# Patient Record
Sex: Male | Born: 1960 | Race: Black or African American | Hispanic: No | Marital: Married | Smoking: Never smoker
Health system: Southern US, Community
[De-identification: ages and names within clinical notes are randomized; demographics above are authoritative.]

## PROBLEM LIST (undated history)

## (undated) DIAGNOSIS — Z8719 Personal history of other diseases of the digestive system: Secondary | ICD-10-CM

## (undated) DIAGNOSIS — K859 Acute pancreatitis without necrosis or infection, unspecified: Secondary | ICD-10-CM

## (undated) HISTORY — PX: HERNIA REPAIR: SHX51

---

## 2013-03-15 ENCOUNTER — Emergency Department (HOSPITAL_COMMUNITY)
Admission: EM | Admit: 2013-03-15 | Discharge: 2013-03-16 | Disposition: A | Discharge status: Home / Self Care | Payer: BC Managed Care – PPO | Attending: Emergency Medicine | Admitting: Emergency Medicine

## 2013-03-15 ENCOUNTER — Encounter (HOSPITAL_COMMUNITY): Payer: Self-pay | Admitting: Emergency Medicine

## 2013-03-15 DIAGNOSIS — R21 Rash and other nonspecific skin eruption: Secondary | ICD-10-CM | POA: Insufficient documentation

## 2013-03-15 DIAGNOSIS — IMO0002 Reserved for concepts with insufficient information to code with codable children: Secondary | ICD-10-CM | POA: Insufficient documentation

## 2013-03-15 DIAGNOSIS — L408 Other psoriasis: Secondary | ICD-10-CM | POA: Insufficient documentation

## 2013-03-15 DIAGNOSIS — L02411 Cutaneous abscess of right axilla: Secondary | ICD-10-CM

## 2013-03-15 DIAGNOSIS — L409 Psoriasis, unspecified: Secondary | ICD-10-CM

## 2013-03-15 NOTE — ED Notes (Signed)
PT. REPORTS ABSCESS AT RIGHT AXILLA WITH DRAINAGE FOR SEVERAL DAYS .

## 2013-03-16 LAB — GLUCOSE, CAPILLARY: Glucose-Capillary: 94 mg/dL (ref 70–99)

## 2013-03-16 MED ORDER — SULFAMETHOXAZOLE-TRIMETHOPRIM 800-160 MG PO TABS: 1.0000 | ORAL_TABLET | Freq: Two times a day (BID) | ORAL | Status: DC

## 2013-03-16 MED ORDER — TRIAMCINOLONE ACETONIDE 0.1 % EX CREA: TOPICAL_CREAM | Freq: Two times a day (BID) | CUTANEOUS | Status: DC

## 2013-03-16 NOTE — ED Provider Notes (Signed)
Medical screening examination/treatment/procedure(s) were performed by non-physician practitioner and as supervising physician I was immediately available for consultation/collaboration.   Hanley Seamen, MD 03/16/13 [phone]

## 2013-03-16 NOTE — ED Provider Notes (Signed)
History     CSN: [id number]  Arrival date & time 03/15/13  2117   First MD Initiated Contact with Patient 03/16/13 0006      Chief Complaint  Patient presents with  . Abscess    (Consider location/radiation/quality/duration/timing/severity/associated sxs/prior treatment) Patient is a 52 y.o. male presenting with abscess. The history is provided by the patient and medical records. No language interpreter was used.  Abscess Location:  Shoulder/arm Shoulder/arm abscess location:  R axilla Abscess quality: draining, fluctuance, induration, painful, redness and warmth   Red streaking: no   Duration:  1 week Progression:  Worsening Pain details:    Quality:  Pressure and aching   Severity:  Mild   Timing:  Constant   Progression:  Unchanged Chronicity:  New Context: not diabetes, not immunosuppression, not injected drug use, not insect bite/sting and not skin injury   Relieved by:  Nothing Worsened by:  Nothing tried Ineffective treatments:  None tried Associated symptoms: no anorexia, no fatigue, no fever, no headaches, no nausea and no vomiting   Risk factors: prior abscess   Risk factors: no family hx of MRSA and no hx of MRSA     Adam Shaw is a 52 y.o. male  with no medical hx presents to the Emergency Department complaining of gradual, persistent, progressively worsening multiple abscesses onset 1 month ago.  pt states one on his hip and several on his arm that have healed without intervention.  He now has one in the right armpit that has been present for > 1 week and is not healing.  Pt states he has been using peroxide on the site after his shower each night.  The site began to drain several days ago. Associated symptoms include pain and drainage at the site.  Nothing makes it better and nothing makes it worse.  Pt denies fever, chills, headache, neck pain, chest pain, shortness of breath, abdominal pain, nausea, vomiting, diarrhea, weakness dizziness, syncope.    Pt also  c/o rash behind his L ear.  Described as itching and sore, appears intermittently and resolves spontaneously.  Pt states this episode has been present for 1 month.  Pt was given cream a long time ago for the rash, but does not remember what kind and does not have any more. There is no drainage from the site and no one else in the household has a rash.     History reviewed. No pertinent past medical history.  History reviewed. No pertinent past surgical history.  No family history on file.  History  Substance Use Topics  . Smoking status: Never Smoker   . Smokeless tobacco: Not on file  . Alcohol Use: No      Review of Systems  Constitutional: Negative for fever and fatigue.  HENT: Negative for neck pain and neck stiffness.   Respiratory: Negative for chest tightness and shortness of breath.   Cardiovascular: Negative for chest pain.  Gastrointestinal: Negative for nausea, vomiting and anorexia.  Musculoskeletal: Negative for joint swelling and arthralgias.  Skin: Positive for rash and wound.  Allergic/Immunologic: Negative for immunocompromised state.  Neurological: Negative for headaches.  Hematological: Does not bruise/bleed easily.  Psychiatric/Behavioral: The patient is not nervous/anxious.     Allergies  Review of patient's allergies indicates no known allergies.  Home Medications   Current Outpatient Rx  Name  Route  Sig  Dispense  Refill  . sulfamethoxazole-trimethoprim (SEPTRA DS) 800-160 MG per tablet   Oral   Take 1 tablet by mouth  every 12 (twelve) hours.   20 tablet   0   . triamcinolone cream (KENALOG) 0.1 %   Topical   Apply topically 2 (two) times daily.   30 g   1     BP 104/73  Pulse 58  Temp(Src) 98.1 F (36.7 C) (Oral)  Resp 14  SpO2 97%  Physical Exam  Nursing note and vitals reviewed. Constitutional: He is oriented to person, place, and time. He appears well-developed and well-nourished. No distress.  HENT:  Head: Normocephalic and  atraumatic.  Mouth/Throat: Oropharynx is clear and moist.  Eyes: Conjunctivae are normal. Pupils are equal, round, and reactive to light. No scleral icterus.  Neck: Normal range of motion.  Pt with 7 x7 cm inflammed, salmon colored patch with silver scales behind the L ear  Cardiovascular: Normal rate, regular rhythm, normal heart sounds and intact distal pulses.   Pulmonary/Chest: Effort normal and breath sounds normal. No respiratory distress. He has no wheezes. He has no rales.  Abdominal: Soft. He exhibits no distension. There is no tenderness.  Musculoskeletal: Normal range of motion.  Lymphadenopathy:    He has no cervical adenopathy.  Neurological: He is alert and oriented to person, place, and time. He exhibits normal muscle tone. Coordination normal.  Skin: Skin is warm and dry. He is not diaphoretic. There is erythema.  4cm x 2 cm abscess in the right axilla, erythema and induration with small amount of drainage, but pocket of fluctuance palpable, no streaking  No additional patches of rash noted to extensor surfaces of arms or legs  Psychiatric: He has a normal mood and affect.    ED Course  INCISION AND DRAINAGE Date/Time: 03/16/2013 1:44 AM Performed by: Dierdre Forth Authorized by: Dierdre Forth Consent: Verbal consent obtained. Risks and benefits: risks, benefits and alternatives were discussed Consent given by: patient Patient understanding: patient states understanding of the procedure being performed Patient consent: the patient's understanding of the procedure matches consent given Procedure consent: procedure consent matches procedure scheduled Relevant documents: relevant documents present and verified Site marked: the operative site was marked Required items: required blood products, implants, devices, and special equipment available Patient identity confirmed: verbally with patient and arm band Time out: Immediately prior to procedure a "time out"  was called to verify the correct patient, procedure, equipment, support staff and site/side marked as required. Type: abscess Body area: upper extremity (right axilla) Anesthesia: local infiltration Local anesthetic: lidocaine 2% without epinephrine Anesthetic total: 5 ml Patient sedated: no Scalpel size: 11 Incision type: elliptical Complexity: complex Drainage: purulent Drainage amount: moderate Wound treatment: wound left open Patient tolerance: Patient tolerated the procedure well with no immediate complications.   (including critical care time)  Labs Reviewed  GLUCOSE, CAPILLARY   No results found.   1. Abscess of axilla, right   2. Psoriasis       MDM  Adam Shaw presents with abscess.  Pt with reports of recurrent abscess, but the is the first time he has been evaluated.  Patient with skin abscess amenable to incision and drainage.  Abscess was not large enough to warrant packing or drain,  wound recheck in 2 days. Encouraged home warm soaks and flushing.  Mild signs of cellulitis is surrounding skin.  Will d/c to home with bactrim due to concerns for MRSA with recurrent abscess.  Pt without Hx of diabetes and CBG 94 in the department.  No hx of immunosuppression, no risk for HIV, no recent use of steroids.  Encouraged  pt to find PCP or return to urgent care for wound check.  He said also with rash consistent with psoriasis. Will send home with triamcinolone cream twice a day. I have also discussed reasons to return immediately to the ER.  Patient expresses understanding and agrees with plan.          Adam Client Isreal Moline, PA-C 03/16/13 [phone]

## 2013-12-31 ENCOUNTER — Encounter (HOSPITAL_COMMUNITY): Payer: Self-pay | Admitting: Emergency Medicine

## 2013-12-31 ENCOUNTER — Inpatient Hospital Stay (HOSPITAL_COMMUNITY)
Admission: EM | Admit: 2013-12-31 | Discharge: 2014-01-01 | DRG: 440 | Disposition: A | Discharge status: Home / Self Care | Payer: BC Managed Care – PPO | Attending: Internal Medicine | Admitting: Internal Medicine

## 2013-12-31 ENCOUNTER — Inpatient Hospital Stay (HOSPITAL_COMMUNITY): Payer: BC Managed Care – PPO

## 2013-12-31 ENCOUNTER — Emergency Department (HOSPITAL_COMMUNITY): Payer: BC Managed Care – PPO

## 2013-12-31 DIAGNOSIS — K859 Acute pancreatitis without necrosis or infection, unspecified: Principal | ICD-10-CM | POA: Insufficient documentation

## 2013-12-31 DIAGNOSIS — K219 Gastro-esophageal reflux disease without esophagitis: Secondary | ICD-10-CM | POA: Diagnosis present

## 2013-12-31 LAB — COMPREHENSIVE METABOLIC PANEL
ALBUMIN: 3.3 g/dL — AB (ref 3.5–5.2)
ALK PHOS: 99 U/L (ref 39–117)
ALT: 29 U/L (ref 0–53)
AST: 49 U/L — ABNORMAL HIGH (ref 0–37)
BUN: 19 mg/dL (ref 6–23)
CALCIUM: 8.8 mg/dL (ref 8.4–10.5)
CO2: 26 mEq/L (ref 19–32)
Chloride: 102 mEq/L (ref 96–112)
Creatinine, Ser: 0.95 mg/dL (ref 0.50–1.35)
GFR calc non Af Amer: >90 mL/min (ref >90)
GLUCOSE: 177 mg/dL — AB (ref 70–99)
POTASSIUM: 3.9 meq/L (ref 3.7–5.3)
SODIUM: 140 meq/L (ref 137–147)
TOTAL PROTEIN: 7.1 g/dL (ref 6.0–8.3)
Total Bilirubin: 0.3 mg/dL (ref 0.3–1.2)

## 2013-12-31 LAB — CBC WITH DIFFERENTIAL/PLATELET
BASOS PCT: 0 % (ref 0–1)
Basophils Absolute: 0 10*3/uL (ref 0.0–0.1)
EOS ABS: 0.1 10*3/uL (ref 0.0–0.7)
EOS PCT: 1 % (ref 0–5)
HCT: 41.1 % (ref 39.0–52.0)
Hemoglobin: 13.6 g/dL (ref 13.0–17.0)
LYMPHS ABS: 2.5 10*3/uL (ref 0.7–4.0)
Lymphocytes Relative: 29 % (ref 12–46)
MCH: 27.2 pg (ref 26.0–34.0)
MCHC: 33.1 g/dL (ref 30.0–36.0)
MCV: 82.2 fL (ref 78.0–100.0)
Monocytes Absolute: 0.5 10*3/uL (ref 0.1–1.0)
Monocytes Relative: 6 % (ref 3–12)
NEUTROS PCT: 64 % (ref 43–77)
Neutro Abs: 5.4 10*3/uL (ref 1.7–7.7)
PLATELETS: 167 10*3/uL (ref 150–400)
RBC: 5 MIL/uL (ref 4.22–5.81)
RDW: 13.6 % (ref 11.5–15.5)
WBC: 8.5 10*3/uL (ref 4.0–10.5)

## 2013-12-31 LAB — POCT I-STAT TROPONIN I: TROPONIN I, POC: 0 ng/mL (ref 0.00–0.08)

## 2013-12-31 LAB — URINALYSIS, ROUTINE W REFLEX MICROSCOPIC
BILIRUBIN URINE: NEGATIVE
Glucose, UA: NEGATIVE mg/dL
HGB URINE DIPSTICK: NEGATIVE
Ketones, ur: NEGATIVE mg/dL
Leukocytes, UA: NEGATIVE
NITRITE: NEGATIVE
PH: 6.5 (ref 5.0–8.0)
Protein, ur: NEGATIVE mg/dL
SPECIFIC GRAVITY, URINE: 1.028 (ref 1.005–1.030)
UROBILINOGEN UA: 1 mg/dL (ref 0.0–1.0)

## 2013-12-31 LAB — TRIGLYCERIDES: TRIGLYCERIDES: 40 mg/dL (ref <150)

## 2013-12-31 LAB — LIPASE, BLOOD: LIPASE: 718 U/L — AB (ref 11–59)

## 2013-12-31 MED ORDER — SODIUM CHLORIDE 0.9 % IV SOLN
INTRAVENOUS | Status: DC
  Administered 2013-12-31 – 2014-01-01 (×2): via INTRAVENOUS
  Administered 2014-01-01 (×2): 1000 mL via INTRAVENOUS

## 2013-12-31 MED ORDER — SODIUM CHLORIDE 0.9 % IV SOLN
Freq: Once | INTRAVENOUS | Status: AC
  Administered 2013-12-31: 04:00:00 via INTRAVENOUS

## 2013-12-31 MED ORDER — PANTOPRAZOLE SODIUM 40 MG IV SOLR
40.0000 mg | INTRAVENOUS | Status: DC
  Administered 2013-12-31 – 2014-01-01 (×2): 40 mg via INTRAVENOUS
  Filled 2013-12-31 (×3): qty 40

## 2013-12-31 MED ORDER — ONDANSETRON HCL 4 MG PO TABS: 4.0000 mg | ORAL_TABLET | Freq: Four times a day (QID) | ORAL | Status: DC | PRN

## 2013-12-31 MED ORDER — MORPHINE SULFATE 2 MG/ML IJ SOLN: 2.0000 mg | INTRAMUSCULAR | Status: DC | PRN

## 2013-12-31 MED ORDER — MORPHINE SULFATE 4 MG/ML IJ SOLN
4.0000 mg | Freq: Once | INTRAMUSCULAR | Status: AC
  Administered 2013-12-31: 4 mg via INTRAVENOUS
  Filled 2013-12-31: qty 1

## 2013-12-31 MED ORDER — ONDANSETRON HCL 4 MG/2ML IJ SOLN: 4.0000 mg | Freq: Four times a day (QID) | INTRAMUSCULAR | Status: DC | PRN

## 2013-12-31 MED ORDER — HEPARIN SODIUM (PORCINE) 5000 UNIT/ML IJ SOLN
5000.0000 [IU] | Freq: Three times a day (TID) | INTRAMUSCULAR | Status: DC
  Administered 2013-12-31 – 2014-01-01 (×4): 5000 [IU] via SUBCUTANEOUS
  Filled 2013-12-31 (×6): qty 1

## 2013-12-31 MED ORDER — GADOBENATE DIMEGLUMINE 529 MG/ML IV SOLN
15.0000 mL | Freq: Once | INTRAVENOUS | Status: AC | PRN
  Administered 2013-12-31: 11 mL via INTRAVENOUS

## 2013-12-31 NOTE — ED Notes (Signed)
Dr. Horton at bedside. 

## 2013-12-31 NOTE — ED Provider Notes (Signed)
CSN: [id number]     Arrival date & time 12/31/13  0035 History   First MD Initiated Contact with Patient 12/31/13 0111     Chief Complaint  Patient presents with  . Abdominal Pain   (Consider location/radiation/quality/duration/timing/severity/associated sxs/prior Treatment) HPI  This a 53 year old male with no significant past medical history who presents with epigastric abdominal pain. Onset of symptoms was approximately one hour prior to arrival. Patient reports "fullness in her stomach." He states that it radiated upwards into his chest. He had just eating. Patient denies any chest pain or shortness of breath. He does state that the pain made him feel like he could not catch his breath. Currently his pain is 4/10. He's never had anything like this in the past. He denies any nausea vomiting or diarrhea. He denies any fevers.  History reviewed. No pertinent past medical history. Past Surgical History  Procedure Laterality Date  . Hernia repair     No family history on file. History  Substance Use Topics  . Smoking status: Never Smoker   . Smokeless tobacco: Not on file  . Alcohol Use: No    Review of Systems  Constitutional: Negative.  Negative for fever.  Respiratory: Negative.  Negative for chest tightness and shortness of breath.   Cardiovascular: Negative.  Negative for chest pain.  Gastrointestinal: Positive for abdominal pain. Negative for nausea, vomiting and diarrhea.  Genitourinary: Negative.  Negative for dysuria.  Musculoskeletal: Negative for back pain.  Neurological: Negative for headaches.  All other systems reviewed and are negative.    Allergies  Review of patient's allergies indicates no known allergies.  Home Medications   Current Outpatient Rx  Name  Route  Sig  Dispense  Refill  . Multiple Vitamin (MULTIVITAMIN WITH MINERALS) TABS tablet   Oral   Take 1 tablet by mouth daily.          BP 100/67  Pulse 65  Temp(Src) 98.7 F (37.1 C) (Oral)   Resp 20  Ht 5\' 7"  (1.702 m)  Wt 125 lb (56.7 kg)  BMI 19.57 kg/m2  SpO2 99% Physical Exam  Nursing note and vitals reviewed. Constitutional: He is oriented to person, place, and time. He appears well-developed and well-nourished. No distress.  HENT:  Head: Normocephalic and atraumatic.  Eyes: Pupils are equal, round, and reactive to light.  Neck: Neck supple.  Cardiovascular: Normal rate, regular rhythm and normal heart sounds.   No murmur heard. Pulmonary/Chest: Effort normal and breath sounds normal. No respiratory distress. He has no wheezes.  Abdominal: Soft. Bowel sounds are normal. There is tenderness. There is no rebound.  Tenderness palpation of the epigastrium without rebound or guarding  Musculoskeletal: He exhibits no edema.  Lymphadenopathy:    He has no cervical adenopathy.  Neurological: He is alert and oriented to person, place, and time.  Skin: Skin is warm and dry.  Psychiatric: He has a normal mood and affect.    ED Course  Procedures (including critical care time) Labs Review Labs Reviewed  COMPREHENSIVE METABOLIC PANEL - Abnormal; Notable for the following:    Glucose, Bld 177 (*)    Albumin 3.3 (*)    AST 49 (*)    All other components within normal limits  LIPASE, BLOOD - Abnormal; Notable for the following:    Lipase 718 (*)    All other components within normal limits  CBC WITH DIFFERENTIAL  URINALYSIS, ROUTINE W REFLEX MICROSCOPIC  POCT I-STAT TROPONIN I   Imaging Review Dg Chest  2 View  12/31/2013   CLINICAL DATA:  Epigastric pain and bloating.  EXAM: CHEST  2 VIEW  COMPARISON:  None available for comparison at time of study interpretation.  FINDINGS: Cardiomediastinal silhouette is unremarkable. The lungs are clear without pleural effusions or focal consolidations. Trace biapical pleural thickening. Trachea projects midline and there is no pneumothorax. Soft tissue planes and included osseous structures are non-suspicious. Multiple EKG lines  overlie the patient and may obscure subtle underlying pathology.  IMPRESSION: No active cardiopulmonary disease.   Electronically Signed   By: Elon Alas   On: 12/31/2013 03:11   US Abdomen Complete  12/31/2013   CLINICAL DATA:  Acute pancreatitis  EXAM: ULTRASOUND ABDOMEN COMPLETE  COMPARISON:  None.  FINDINGS: Gallbladder:  There is thickening of the gallbladder wall to 4 mm, with echogenic intramural foci with ring down artifact. No stone is seen. No focal tenderness.  Common bile duct:  Diameter: Measures up to 9 mm. No visible filling defect. The it is noted that there is no cholestasis on contemporaneously labs.  Liver:  No focal lesion identified. Within normal limits in parenchymal echogenicity.  IVC:  No abnormality visualized.  Pancreas:  Visualized portion unremarkable.  Spleen:  Size and appearance within normal limits.  Right Kidney:  Length: 9 cm. 2 cm peripelvic, simple appearing cyst. No hydronephrosis.  Left Kidney:  Length: 9 cm. Echogenicity within normal limits. No mass or hydronephrosis visualized.  Abdominal aorta:  No aneurysm visualized.  Other findings:  None.  IMPRESSION: 1. No cholelithiasis. 2. Enlargement of the common bile duct to 9 mm. No visible choledocholithiasis. 3. Adenomyomatosis. 4. 2 cm right renal cyst.   Electronically Signed   By: Jorje Guild M.D.   On: 12/31/2013 05:41    EKG Interpretation    Date/Time:  Monday December 31 2013 01:05:15 EST Ventricular Rate:  62 PR Interval:  150 QRS Duration: 84 QT Interval:  384 QTC Calculation: 389 R Axis:   -15 Text Interpretation:  Normal sinus rhythm Nonspecific T wave abnormality No prior for comparison   Confirmed by HORTON  MD, COURTNEY ([id number]) on 12/31/2013 2:03:55 AM            MDM   1. Acute pancreatitis     Patient presents with epigastric pain. He is nontoxic-appearing on exam. No evidence of peritonitis. Initial vital signs notable for blood pressure of 98/63. He is generally comfortable  appearing. Patient initially declined pain medication. Lab work is notable for a lipase of >700.  I reexamined the patient and he continues to not have any signs of peritonitis. I will give him 4 mg of morphine and he was started on maintenance fluids. Patient denies any history of gallstones, alcohol abuse, or high cholesterol. Will admit the patient for further management.    Merryl Hacker, MD 12/31/13 [phone]

## 2013-12-31 NOTE — ED Notes (Signed)
Admitting physician, Dr. Posey Pronto, at bedside.

## 2013-12-31 NOTE — Progress Notes (Signed)
Received report from ED.  

## 2013-12-31 NOTE — Consult Note (Signed)
[facility] Gastroenterology Consultation Note  Referring Provider: Dr. Marcheta Grammes [facility]) Primary Care Physician:  No PCP Per Patient  Reason for Consultation:  Acute pancreatitis  HPI: Adam Shaw is a 53 y.o. male whom we've been asked to see for acute pancreatitis.  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  Yesterday, patient had rather acute onset of epigastric pain with radiation to his back. Some nausea and vomiting.  Went to ED, was found to have pancreatitis based on labs (lipase > 700).  Ultrasound showed no gallstones/sludge, but this study and his MRCP showed CBD 16mm.  Liver tests essentially normal.  No prior personal history of pancreatitis.  No family history of pancreatitis.  Takes multivitamin, but no other chronic medications.  Pain today is much improved compared with yesterday.   History reviewed. No pertinent past medical history.  Past Surgical History  Procedure Laterality Date  . Hernia repair      Prior to Admission medications   Medication Sig Start Date End Date Taking? Authorizing Provider  Multiple Vitamin (MULTIVITAMIN WITH MINERALS) TABS tablet Take 1 tablet by mouth daily.   Yes Historical Provider, MD    Current Facility-Administered Medications  Medication Dose Route Frequency Provider Last Rate Last Dose  . 0.9 %  sodium chloride infusion   Intravenous Continuous Berle Mull, MD 125 mL/hr at 12/31/13 0845    . heparin injection 5,000 Units  5,000 Units Subcutaneous Q8H Berle Mull, MD   5,000 Units at 12/31/13 1200  . morphine 2 MG/ML injection 2 mg  2 mg Intravenous Q3H PRN Berle Mull, MD      . ondansetron (ZOFRAN) tablet 4 mg  4 mg Oral Q6H PRN Berle Mull, MD       Or  . ondansetron (ZOFRAN) injection 4 mg  4 mg Intravenous Q6H PRN Berle Mull, MD      . pantoprazole (PROTONIX) injection 40 mg  40 mg Intravenous Q24H Berle Mull, MD   40 mg at 12/31/13 0804    Allergies as of 12/31/2013  . (No Known Allergies)    History  reviewed. No pertinent family history.  History   Social History  . Marital Status: Married    Spouse Name: N/A    Number of Children: N/A  . Years of Education: N/A   Occupational History  . Not on file.   Social History Main Topics  . Smoking status: Never Smoker   . Smokeless tobacco: Not on file  . Alcohol Use: No  . Drug Use: No  . Sexual Activity: Yes   Other Topics Concern  . Not on file   Social History Narrative  . No narrative on file    Review of Systems: As per HPI, all others negative.  Physical Exam: Vital signs in last 24 hours: Temp:  [97.7 F (36.5 C)-98.7 F (37.1 C)] 97.7 F (36.5 C) (02/02 0833) Pulse Rate:  [56-74] 62 (02/02 0833) Resp:  [16-20] 20 (02/02 0215) BP: (98-109)/(60-82) 102/65 mmHg (02/02 0833) SpO2:  [97 %-99 %] 98 % (02/02 0833) Weight:  [56.7 kg (125 lb)] 56.7 kg (125 lb) (02/02 [id number])   General:   Alert,  Somnolent but arousable, Well-developed, well-nourished, pleasant and cooperative in NAD Head:  Normocephalic and atraumatic. Eyes:  Sclera clear, no icterus.   Conjunctiva pink. Ears:  Normal auditory acuity. Nose:  No deformity, discharge,  or lesions. Mouth:  No deformity or lesions.  Oropharynx pink but somewhat dry. Neck:  Supple; no masses or  thyromegaly. Lungs:  Clear throughout to auscultation.   No wheezes, crackles, or rhonchi. No acute distress. Heart:  Regular rate and rhythm; no murmurs, clicks, rubs,  or gallops. Abdomen:  Soft, nondistended, mild epigastric tenderness to deep palpation. No masses, hepatosplenomegaly or hernias noted. Normal bowel sounds, without guarding, and without rebound.     Msk:  Symmetrical without gross deformities. Normal posture. Pulses:  Normal pulses noted. Extremities:  Without clubbing or edema. Neurologic:  Alert and  oriented x4;  Diffusely weak, otherwise grossly normal neurologically. Skin:  Intact without significant lesions or rashes. Psych:  Alert and cooperative. Normal  mood and affect.   Lab Results:  Recent Labs  12/31/13 0103  WBC 8.5  HGB 13.6  HCT 41.1  PLT 167   BMET  Recent Labs  12/31/13 0103  NA 140  K 3.9  CL 102  CO2 26  GLUCOSE 177*  BUN 19  CREATININE 0.95  CALCIUM 8.8   LFT  Recent Labs  12/31/13 0103  PROT 7.1  ALBUMIN 3.3*  AST 49*  ALT 29  ALKPHOS 99  BILITOT 0.3   PT/INR No results found for this basename: LABPROT, INR,  in the last 72 hours  Studies/Results: Dg Chest 2 View  12/31/2013   CLINICAL DATA:  Epigastric pain and bloating.  EXAM: CHEST  2 VIEW  COMPARISON:  None available for comparison at time of study interpretation.  FINDINGS: Cardiomediastinal silhouette is unremarkable. The lungs are clear without pleural effusions or focal consolidations. Trace biapical pleural thickening. Trachea projects midline and there is no pneumothorax. Soft tissue planes and included osseous structures are non-suspicious. Multiple EKG lines overlie the patient and may obscure subtle underlying pathology.  IMPRESSION: No active cardiopulmonary disease.   Electronically Signed   By: Elon Alas   On: 12/31/2013 03:11   US Abdomen Complete  12/31/2013   CLINICAL DATA:  Acute pancreatitis  EXAM: ULTRASOUND ABDOMEN COMPLETE  COMPARISON:  None.  FINDINGS: Gallbladder:  There is thickening of the gallbladder wall to 4 mm, with echogenic intramural foci with ring down artifact. No stone is seen. No focal tenderness.  Common bile duct:  Diameter: Measures up to 9 mm. No visible filling defect. The it is noted that there is no cholestasis on contemporaneously labs.  Liver:  No focal lesion identified. Within normal limits in parenchymal echogenicity.  IVC:  No abnormality visualized.  Pancreas:  Visualized portion unremarkable.  Spleen:  Size and appearance within normal limits.  Right Kidney:  Length: 9 cm. 2 cm peripelvic, simple appearing cyst. No hydronephrosis.  Left Kidney:  Length: 9 cm. Echogenicity within normal limits. No  mass or hydronephrosis visualized.  Abdominal aorta:  No aneurysm visualized.  Other findings:  None.  IMPRESSION: 1. No cholelithiasis. 2. Enlargement of the common bile duct to 9 mm. No visible choledocholithiasis. 3. Adenomyomatosis. 4. 2 cm right renal cyst.   Electronically Signed   By: Jorje Guild M.D.   On: 12/31/2013 05:41   Mr 3d Recon At Scanner  12/31/2013   CLINICAL DATA:  Epigastric abdominal pain. Dilated common bile duct on ultrasound.  EXAM: MRI ABDOMEN WITHOUT AND WITH CONTRAST (INCLUDING MRCP)  TECHNIQUE: Multiplanar multisequence MR imaging of the abdomen was performed both before and after the administration of intravenous contrast. Heavily T2-weighted images of the biliary and pancreatic ducts were obtained, and three-dimensional MRCP images were rendered by post processing.  CONTRAST:  80mL MULTIHANCE GADOBENATE DIMEGLUMINE 529 MG/ML IV SOLN  COMPARISON:  MR  MRCP WO/W CM dated 12/31/2013; US ABDOMEN COMPLETE dated 12/31/2013  FINDINGS: Probable volume loss and atelectasis the anterior right lung base. Normal liver, spleen. Underdistended proximal stomach.  Normal gallbladder. Normal pancreas, without pancreatic ductal dilatation.  The intrahepatic ducts are minimally dilated, including a 4 mm left hepatic duct on image 75/series 11. The common duct measures 7 mm maximally in the porta hepatis on image 67/series 11. Upper normal 6 mm in this age group. Tapers to 5 mm in the region of the pancreatic head. No evidence of obstructive stone or mass.  Normal adrenal glands. Tiny bilateral renal cysts with a cyst or minimally complex cyst in the interpolar right kidney measuring 1.6 cm.  No abdominal adenopathy or ascites.  IMPRESSION: 1. Minimal intra and extrahepatic biliary ductal dilatation, without evidence of obstructive stone or mass. This could be within normal variation for this patient. If there is a high clinical concern of otherwise occult ampullary stenosis or ampullary lesion, ERCP  should be considered. 2. No other explanation for epigastric abdominal pain.   Electronically Signed   By: Abigail Miyamoto M.D.   On: 12/31/2013 13:33   Mr Jeananne Rama W/wo Cm/mrcp  12/31/2013   CLINICAL DATA:  Epigastric abdominal pain. Dilated common bile duct on ultrasound.  EXAM: MRI ABDOMEN WITHOUT AND WITH CONTRAST (INCLUDING MRCP)  TECHNIQUE: Multiplanar multisequence MR imaging of the abdomen was performed both before and after the administration of intravenous contrast. Heavily T2-weighted images of the biliary and pancreatic ducts were obtained, and three-dimensional MRCP images were rendered by post processing.  CONTRAST:  64mL MULTIHANCE GADOBENATE DIMEGLUMINE 529 MG/ML IV SOLN  COMPARISON:  MR MRCP WO/W CM dated 12/31/2013; US ABDOMEN COMPLETE dated 12/31/2013  FINDINGS: Probable volume loss and atelectasis the anterior right lung base. Normal liver, spleen. Underdistended proximal stomach.  Normal gallbladder. Normal pancreas, without pancreatic ductal dilatation.  The intrahepatic ducts are minimally dilated, including a 4 mm left hepatic duct on image 75/series 11. The common duct measures 7 mm maximally in the porta hepatis on image 67/series 11. Upper normal 6 mm in this age group. Tapers to 5 mm in the region of the pancreatic head. No evidence of obstructive stone or mass.  Normal adrenal glands. Tiny bilateral renal cysts with a cyst or minimally complex cyst in the interpolar right kidney measuring 1.6 cm.  No abdominal adenopathy or ascites.  IMPRESSION: 1. Minimal intra and extrahepatic biliary ductal dilatation, without evidence of obstructive stone or mass. This could be within normal variation for this patient. If there is a high clinical concern of otherwise occult ampullary stenosis or ampullary lesion, ERCP should be considered. 2. No other explanation for epigastric abdominal pain.   Electronically Signed   By: Abigail Miyamoto M.D.   On: 12/31/2013 13:33   Impression:  1.  Acute idiopathic  pancreatitis.  Patient is not clinically toxic at this time, has no features of severe pancreatitis.  Liver tests essentially normal, except mild elevation of AST.  No alcohol and no obvious gallstones or CBD stones on Ultrasound and MRCP.  Plan:  1.  Fasting triglycerides.  ANA, IgG-4 levels. 2.  After labs, start sips clear liquids. 3.  Continued volume repletion with IV fluids and judicious analgesics. 4.  Pending above labs and work-up, if etiology of pancreatitis remains unclear, could consider outpatient endoscopic ultrasound. 5.  Will follow; thank you for the consult.   LOS: 0 days   Amaree Leeper M  12/31/2013, 4:03 PM

## 2013-12-31 NOTE — Progress Notes (Signed)
UR completed. Iylah Dworkin RN CCM Case Mgmt 

## 2013-12-31 NOTE — ED Notes (Signed)
Pt. reports mid/upper abdominal pain onset this evening , denies nausea /vomitting or diarrhea . No fever or chills.

## 2013-12-31 NOTE — Progress Notes (Signed)
Patient seen and examined. Admitted after midnight secondary to abd pain, nausea, vomiting. Elevated lipase and also dilated CBD. No gallstones seen on abd [place]. Please referred to H&P by Dr. Posey Pronto for further info/details.  Plan: -will consult surgery and GI -check MRCP -continue NPO status -continue IV fluids, PRN antiemetics and Pain meds. -will check lipid panel  Daine Gunther [phone]

## 2013-12-31 NOTE — H&P (Signed)
[facility] History and Physical  Patient: Adam Shaw  QQP:[id number]  DOB: March 12, 1961  DOS: the patient was seen and examined on 12/31/2013 PCP: No PCP Per Patient  Chief Complaint: Abdominal pain  HPI: Adam Shaw is a 53 y.o. male with no significant Past medical history . The patient is coming from home. The patient presented with complaints of epigastric abdominal pain that started today. He mentions that he felt that his stomach was full and bloated. Along with that he had some burning pain. He did not have any pain in his chest no shortness of breath no fever no chills. He mentions that he never had any similar pain in the right upper quadrant pain with fatty meals in the past no history of prior surgery it the nausea no vomiting no diarrhea no constipation no burning urination no rash anywhere. No weight loss.  Review of Systems: as mentioned in the history of present illness.  A Comprehensive review of the other systems is negative.  History reviewed. No pertinent past medical history. Past Surgical History  Procedure Laterality Date  . Hernia repair     Social History:  reports that he has never smoked. He does not have any smokeless tobacco history on file. He reports that he does not drink alcohol or use illicit drugs. Independent for most of his  ADL.  No Known Allergies  No family history on file.  Prior to Admission medications   Medication Sig Start Date End Date Taking? Authorizing Provider  Multiple Vitamin (MULTIVITAMIN WITH MINERALS) TABS tablet Take 1 tablet by mouth daily.   Yes Historical Provider, MD    Physical Exam: Filed Vitals:   12/31/13 0053 12/31/13 0130 12/31/13 0215  BP: 98/63 104/65 100/67  Pulse: 67 73 65  Temp: 98.7 F (37.1 C)    TempSrc: Oral    Resp: 16 19 20   Height: 5\' 7"  (1.702 m)    Weight: 56.7 kg (125 lb)    SpO2: 98% 98% 99%    General: Alert, Awake and Oriented to Time, Place and Person. Appear in mild  distress Eyes: PERRL ENT: Oral Mucosa clear dry. Neck: No JVD Cardiovascular: S1 and S2 Present, no Murmur, Peripheral Pulses Present Respiratory: Bilateral Air entry equal and Decreased, Clear to Auscultation,  No Crackles, no wheezes Abdomen: Bowel Sound Present, Soft and minimal tender epigastric, Murphy's negative Skin: No Rash Extremities: No Pedal edema, no calf tenderness Neurologic: Grossly Unremarkable.  Labs on Admission:  CBC:  Recent Labs Lab 12/31/13 0103  WBC 8.5  NEUTROABS 5.4  HGB 13.6  HCT 41.1  MCV 82.2  PLT 167    CMP     Component Value Date/Time   NA 140 12/31/2013 0103   K 3.9 12/31/2013 0103   CL 102 12/31/2013 0103   CO2 26 12/31/2013 0103   GLUCOSE 177* 12/31/2013 0103   BUN 19 12/31/2013 0103   CREATININE 0.95 12/31/2013 0103   CALCIUM 8.8 12/31/2013 0103   PROT 7.1 12/31/2013 0103   ALBUMIN 3.3* 12/31/2013 0103   AST 49* 12/31/2013 0103   ALT 29 12/31/2013 0103   ALKPHOS 99 12/31/2013 0103   BILITOT 0.3 12/31/2013 0103   GFRNONAA >90 12/31/2013 0103   GFRAA >90 12/31/2013 0103     Recent Labs Lab 12/31/13 0103  LIPASE 718*   No results found for this basename: AMMONIA,  in the last 168 hours  No results found for this basename: CKTOTAL, CKMB, CKMBINDEX, TROPONINI,  in the last 168 hours  BNP (last 3 results) No results found for this basename: PROBNP,  in the last 8760 hours  Radiological Exams on Admission: Dg Chest 2 View  12/31/2013   CLINICAL DATA:  Epigastric pain and bloating.  EXAM: CHEST  2 VIEW  COMPARISON:  None available for comparison at time of study interpretation.  FINDINGS: Cardiomediastinal silhouette is unremarkable. The lungs are clear without pleural effusions or focal consolidations. Trace biapical pleural thickening. Trachea projects midline and there is no pneumothorax. Soft tissue planes and included osseous structures are non-suspicious. Multiple EKG lines overlie the patient and may obscure subtle underlying pathology.  IMPRESSION: No  active cardiopulmonary disease.   Electronically Signed   By: Elon Alas   On: 12/31/2013 03:11     Assessment/Plan Principal Problem:   Acute pancreatitis   1. Acute pancreatitis The patient is presenting with complaints of abdominal pain which is epigastric in location. He is hemodynamically stable. He has elevated lipase. He does not have any other a lab abnormality. With this he was admitted to the hospital we will get an ultrasound of his abdomen to rule out any gallstones. He denies any history of alcohol or recent viral infection. He denies any similar episode in the past. IV fluids, IV Zofran as needed, IV Protonix.  DVT Prophylaxis: subcutaneous Heparin Nutrition: N.p.o.  Code Status: Full  Family Communication: Family was present at bedside, opportunity was given to ask question and all questions were answered satisfactorily at the time of interview. Disposition: Admitted to inpatient in med-surge unit.  Author: Berle Mull, MD [facility] Pager: [phone] 12/31/2013, 5:34 AM    If 7PM-7AM, please contact night-coverage [email or web address] Password [email or web address]

## 2014-01-01 LAB — CBC
HCT: 39.5 % (ref 39.0–52.0)
Hemoglobin: 13.2 g/dL (ref 13.0–17.0)
MCH: 27.6 pg (ref 26.0–34.0)
MCHC: 33.4 g/dL (ref 30.0–36.0)
MCV: 82.6 fL (ref 78.0–100.0)
PLATELETS: 174 10*3/uL (ref 150–400)
RBC: 4.78 MIL/uL (ref 4.22–5.81)
RDW: 13.5 % (ref 11.5–15.5)
WBC: 7.1 10*3/uL (ref 4.0–10.5)

## 2014-01-01 LAB — COMPREHENSIVE METABOLIC PANEL
ALT: 20 U/L (ref 0–53)
AST: 18 U/L (ref 0–37)
Albumin: 2.8 g/dL — ABNORMAL LOW (ref 3.5–5.2)
Alkaline Phosphatase: 92 U/L (ref 39–117)
BILIRUBIN TOTAL: 0.5 mg/dL (ref 0.3–1.2)
BUN: 8 mg/dL (ref 6–23)
CHLORIDE: 108 meq/L (ref 96–112)
CO2: 24 mEq/L (ref 19–32)
CREATININE: 0.95 mg/dL (ref 0.50–1.35)
Calcium: 8.3 mg/dL — ABNORMAL LOW (ref 8.4–10.5)
GFR calc Af Amer: >90 mL/min (ref >90)
GFR calc non Af Amer: >90 mL/min (ref >90)
Glucose, Bld: 91 mg/dL (ref 70–99)
Potassium: 4.2 mEq/L (ref 3.7–5.3)
Sodium: 142 mEq/L (ref 137–147)
TOTAL PROTEIN: 6.1 g/dL (ref 6.0–8.3)

## 2014-01-01 LAB — LIPID PANEL
CHOL/HDL RATIO: 3.5 ratio
Cholesterol: 151 mg/dL (ref 0–200)
HDL: 43 mg/dL (ref >39)
LDL Cholesterol: 97 mg/dL (ref 0–99)
Triglycerides: 54 mg/dL (ref <150)
VLDL: 11 mg/dL (ref 0–40)

## 2014-01-01 LAB — LIPASE, BLOOD: LIPASE: 24 U/L (ref 11–59)

## 2014-01-01 LAB — IGG, IGA, IGM
IgA: 229 mg/dL (ref 68–379)
IgG (Immunoglobin G), Serum: 1450 mg/dL (ref 650–1600)
IgM, Serum: 62 mg/dL (ref 41–251)

## 2014-01-01 LAB — ANA: ANA: NEGATIVE

## 2014-01-01 MED ORDER — OXYCODONE HCL 5 MG PO TABS: 5.0000 mg | ORAL_TABLET | ORAL | Status: DC | PRN

## 2014-01-01 MED ORDER — OMEPRAZOLE 40 MG PO CPDR: 40.0000 mg | DELAYED_RELEASE_CAPSULE | Freq: Every day | ORAL | Status: DC

## 2014-01-01 NOTE — Discharge Summary (Signed)
Physician Discharge Summary  Adam Shaw [id number] DOB: 16-Mar-1961 DOA: 12/31/2013  PCP: No PCP Per Patient  Admit date: 12/31/2013 Discharge date: 01/01/2014  Time spent: >30 minutes  Recommendations for Outpatient Follow-up:  Follow a low fat diet and advance food quantity slowly Keep yourself well hydrated Tylenol 500mg  every 6 hours as needed for pain; if not relieved then use 1 tablet of oxycodone as prescribed. Arrange visit with a primary care physician to establish care Arrange visit with Dr. Paulita Fujita (Gastroenterologist) in 3-4 weeks foe outpatient endoscopic [place] and colonoscopy.  Discharge Diagnoses:  Principal Problem:   Acute pancreatitis GERD  Discharge Condition: stable and improved. Will discharge home. Outpatient follow up with [facility] for endoscopic [place] and colonoscopy screening   Diet recommendation: low fat diet  Filed Weights   12/31/13 0053 12/31/13 0833  Weight: 56.7 kg (125 lb) 56.7 kg (125 lb)    History of present illness:  53 y.o. male with no significant Past medical history .  The patient presented with complaints of epigastric abdominal pain that started today. He mentions that he felt that his stomach was full and bloated. Along with that he had some burning pain. He did not have any pain in his chest no shortness of breath no fever no chills. He mentions that he never had any similar pain in the right upper quadrant pain with fatty meals in the past no history of prior surgery it the nausea no vomiting no diarrhea no constipation no burning urination no rash anywhere. No weight loss.   Hospital Course:  1-Acute pancreatitis: etiology remains unclear. ?? Viral. -neg work up for gallstones  -unremarkable MRCP -normal lipid panel -no alcohol consumption -negative/WNL IgG, IgA, IgM and ANA -GI was consulted and recommended outpatient endoscopic [place] as next step in work up to r/o any abnormality inside the pancreas. -after IVf's resuscitation adn  bowel rest; patient lipase and electrolytes WNL. Was able to tolerate full liquid diet prior to discharge and is not complaining of any nausea, vomiting, fever or significant pain in abd.  2-GERD: started on PPI  Procedures:  See below for x-ray reports   Consultations:  GI (Dr. Stacie Glaze GI)  Discharge Exam: Filed Vitals:   01/01/14 1230  BP: 120/78  Pulse: 62  Temp: 98 F (36.7 C)  Resp: 18   General: Alert, Awake and Oriented to Time, Place and Person. No distress; no icterus Eyes: PERRL, EOMI ENT: MMM, no erythema or exudates inside his mouth Neck: No JVD, no thyromegaly Cardiovascular: S1 and S2 Present, no Murmur,rubs or gallops. Peripheral Pulses Present and no Le edema Respiratory: CTA bilaterally Abdomen: Bowel Sound Present, Soft and no significant epigastric discomfort with deep palpation.  Skin: No Rashes or petechiae Extremities: No Pedal edema, no calf tenderness  Neurologic: Grossly Unremarkable.    Discharge Instructions  Discharge Orders   Future Orders Complete By Expires   Discharge instructions  As directed    Comments:     Follow a low fat diet and advance food quantity slowly Keep yourself well hydrated Tylenol 500mg  every 6 hours as needed for pain; if not relieved then use 1 tablet of oxycodone as prescribed. Arrange visit with a primary care physician to establish care Arrange visit with Dr. Paulita Fujita (Gastroenterologist) in 3-4 weeks foe outpatient endoscopic [place] and colonoscopy.       Medication List         multivitamin with minerals Tabs tablet  Take 1 tablet by mouth daily.  omeprazole 40 MG capsule  Commonly known as:  PRILOSEC  Take 1 capsule (40 mg total) by mouth daily.     oxyCODONE 5 MG immediate release tablet  Commonly known as:  Oxy IR/ROXICODONE  Take 1 tablet (5 mg total) by mouth every 4 (four) hours as needed for severe pain.       No Known Allergies     Follow-up Information   Follow up with  Landry Dyke, MD. Schedule an appointment as soon as possible for a visit in 3 weeks. (call office to set uo appointment)    Specialty:  Gastroenterology   Contact information:   [address] [place] [address] [phone]       The results of significant diagnostics from this hospitalization (including imaging, microbiology, ancillary and laboratory) are listed below for reference.    Significant Diagnostic Studies: Dg Chest 2 View  12/31/2013   CLINICAL DATA:  Epigastric pain and bloating.  EXAM: CHEST  2 VIEW  COMPARISON:  None available for comparison at time of study interpretation.  FINDINGS: Cardiomediastinal silhouette is unremarkable. The lungs are clear without pleural effusions or focal consolidations. Trace biapical pleural thickening. Trachea projects midline and there is no pneumothorax. Soft tissue planes and included osseous structures are non-suspicious. Multiple EKG lines overlie the patient and may obscure subtle underlying pathology.  IMPRESSION: No active cardiopulmonary disease.   Electronically Signed   By: Elon Alas   On: 12/31/2013 03:11   US Abdomen Complete  12/31/2013   CLINICAL DATA:  Acute pancreatitis  EXAM: ULTRASOUND ABDOMEN COMPLETE  COMPARISON:  None.  FINDINGS: Gallbladder:  There is thickening of the gallbladder wall to 4 mm, with echogenic intramural foci with ring down artifact. No stone is seen. No focal tenderness.  Common bile duct:  Diameter: Measures up to 9 mm. No visible filling defect. The it is noted that there is no cholestasis on contemporaneously labs.  Liver:  No focal lesion identified. Within normal limits in parenchymal echogenicity.  IVC:  No abnormality visualized.  Pancreas:  Visualized portion unremarkable.  Spleen:  Size and appearance within normal limits.  Right Kidney:  Length: 9 cm. 2 cm peripelvic, simple appearing cyst. No hydronephrosis.  Left Kidney:  Length: 9 cm. Echogenicity within normal limits. No  mass or hydronephrosis visualized.  Abdominal aorta:  No aneurysm visualized.  Other findings:  None.  IMPRESSION: 1. No cholelithiasis. 2. Enlargement of the common bile duct to 9 mm. No visible choledocholithiasis. 3. Adenomyomatosis. 4. 2 cm right renal cyst.   Electronically Signed   By: Jorje Guild M.D.   On: 12/31/2013 05:41   Mr 3d Recon At Scanner  12/31/2013   CLINICAL DATA:  Epigastric abdominal pain. Dilated common bile duct on ultrasound.  EXAM: MRI ABDOMEN WITHOUT AND WITH CONTRAST (INCLUDING MRCP)  TECHNIQUE: Multiplanar multisequence MR imaging of the abdomen was performed both before and after the administration of intravenous contrast. Heavily T2-weighted images of the biliary and pancreatic ducts were obtained, and three-dimensional MRCP images were rendered by post processing.  CONTRAST:  39mL MULTIHANCE GADOBENATE DIMEGLUMINE 529 MG/ML IV SOLN  COMPARISON:  MR MRCP WO/W CM dated 12/31/2013; US ABDOMEN COMPLETE dated 12/31/2013  FINDINGS: Probable volume loss and atelectasis the anterior right lung base. Normal liver, spleen. Underdistended proximal stomach.  Normal gallbladder. Normal pancreas, without pancreatic ductal dilatation.  The intrahepatic ducts are minimally dilated, including a 4 mm left hepatic duct on image 75/series 11. The common duct measures  7 mm maximally in the porta hepatis on image 67/series 11. Upper normal 6 mm in this age group. Tapers to 5 mm in the region of the pancreatic head. No evidence of obstructive stone or mass.  Normal adrenal glands. Tiny bilateral renal cysts with a cyst or minimally complex cyst in the interpolar right kidney measuring 1.6 cm.  No abdominal adenopathy or ascites.  IMPRESSION: 1. Minimal intra and extrahepatic biliary ductal dilatation, without evidence of obstructive stone or mass. This could be within normal variation for this patient. If there is a high clinical concern of otherwise occult ampullary stenosis or ampullary lesion, ERCP  should be considered. 2. No other explanation for epigastric abdominal pain.   Electronically Signed   By: Abigail Miyamoto M.D.   On: 12/31/2013 13:33   Mr Jeananne Rama W/wo Cm/mrcp  12/31/2013   CLINICAL DATA:  Epigastric abdominal pain. Dilated common bile duct on ultrasound.  EXAM: MRI ABDOMEN WITHOUT AND WITH CONTRAST (INCLUDING MRCP)  TECHNIQUE: Multiplanar multisequence MR imaging of the abdomen was performed both before and after the administration of intravenous contrast. Heavily T2-weighted images of the biliary and pancreatic ducts were obtained, and three-dimensional MRCP images were rendered by post processing.  CONTRAST:  38mL MULTIHANCE GADOBENATE DIMEGLUMINE 529 MG/ML IV SOLN  COMPARISON:  MR MRCP WO/W CM dated 12/31/2013; US ABDOMEN COMPLETE dated 12/31/2013  FINDINGS: Probable volume loss and atelectasis the anterior right lung base. Normal liver, spleen. Underdistended proximal stomach.  Normal gallbladder. Normal pancreas, without pancreatic ductal dilatation.  The intrahepatic ducts are minimally dilated, including a 4 mm left hepatic duct on image 75/series 11. The common duct measures 7 mm maximally in the porta hepatis on image 67/series 11. Upper normal 6 mm in this age group. Tapers to 5 mm in the region of the pancreatic head. No evidence of obstructive stone or mass.  Normal adrenal glands. Tiny bilateral renal cysts with a cyst or minimally complex cyst in the interpolar right kidney measuring 1.6 cm.  No abdominal adenopathy or ascites.  IMPRESSION: 1. Minimal intra and extrahepatic biliary ductal dilatation, without evidence of obstructive stone or mass. This could be within normal variation for this patient. If there is a high clinical concern of otherwise occult ampullary stenosis or ampullary lesion, ERCP should be considered. 2. No other explanation for epigastric abdominal pain.   Electronically Signed   By: Abigail Miyamoto M.D.   On: 12/31/2013 13:33    Labs: Basic Metabolic Panel:  Recent  Labs Lab 12/31/13 0103 01/01/14 0531  NA 140 142  K 3.9 4.2  CL 102 108  CO2 26 24  GLUCOSE 177* 91  BUN 19 8  CREATININE 0.95 0.95  CALCIUM 8.8 8.3*   Liver Function Tests:  Recent Labs Lab 12/31/13 0103 01/01/14 0531  AST 49* 18  ALT 29 20  ALKPHOS 99 92  BILITOT 0.3 0.5  PROT 7.1 6.1  ALBUMIN 3.3* 2.8*    Recent Labs Lab 12/31/13 0103 01/01/14 0531  LIPASE 718* 24   CBC:  Recent Labs Lab 12/31/13 0103 01/01/14 0531  WBC 8.5 7.1  NEUTROABS 5.4  --   HGB 13.6 13.2  HCT 41.1 39.5  MCV 82.2 82.6  PLT 167 174    Signed:  Kayshaun Polanco  [facility] 01/01/2014, 3:57 PM

## 2014-01-01 NOTE — Care Management Note (Signed)
    Page 1 of 1   01/01/2014     4:39:25 PM   CARE MANAGEMENT NOTE 01/01/2014  Patient:  Starkey,Tyion   Account Number:  [id number]  Date Initiated:  01/01/2014  Documentation initiated by:  Tomi Bamberger  Subjective/Objective Assessment:   dx acute pancreatitis,  admit- lives with wife.     Action/Plan:   Anticipated DC Date:  01/01/2014   Anticipated DC Plan:  [place]  CM consult      Choice offered to / List presented to:             Status of service:  Completed, signed off Medicare Important Message given?   (If response is "NO", the following Medicare IM given date fields will be blank) Date Medicare IM given:   Date Additional Medicare IM given:    Discharge Disposition:  HOME/SELF CARE  Per UR Regulation:  Reviewed for med. necessity/level of care/duration of stay  If discussed at [place] of Stay Meetings, dates discussed:    Comments:  01/01/14 16:37 Tomi Bamberger RN, BSN [phone] patient lives with spouse, pta indep.  NCM gave patient a list of PCP's , he will call and make an f/u apt.

## 2014-01-01 NOTE — Progress Notes (Signed)
Subjective: Pain improving.  Objective: Vital signs in last 24 hours: Temp:  [97.7 F (36.5 C)-98.6 F (37 C)] 97.7 F (36.5 C) (02/03 0443) Pulse Rate:  [68-69] 69 (02/03 0443) Resp:  [18] 18 (02/03 0443) BP: (103-109)/(67-71) 109/71 mmHg (02/03 0443) SpO2:  [97 %] 97 % (02/03 0443) Weight change: 0 kg (0 lb) Last BM Date:  (PTA)  PE: GEN:  NAD ABD:  Soft, hypoactive but present bowel sounds, very mild periumbilical tenderness  Lab Results: CMP     Component Value Date/Time   NA 142 01/01/2014 0531   K 4.2 01/01/2014 0531   CL 108 01/01/2014 0531   CO2 24 01/01/2014 0531   GLUCOSE 91 01/01/2014 0531   BUN 8 01/01/2014 0531   CREATININE 0.95 01/01/2014 0531   CALCIUM 8.3* 01/01/2014 0531   PROT 6.1 01/01/2014 0531   ALBUMIN 2.8* 01/01/2014 0531   AST 18 01/01/2014 0531   ALT 20 01/01/2014 0531   ALKPHOS 92 01/01/2014 0531   BILITOT 0.5 01/01/2014 0531   GFRNONAA >90 01/01/2014 0531   GFRAA >90 01/01/2014 0531   Triglyceride normal Immunoglobulins normal. ANA pending.  Abdominal US and MRCP showed no obvious gallstones or choledocholithiasis  Assessment:  1.  Idiopathic pancreatitis, improving.  No obvious gallstones.  Normal Triglyceride levels.  Doesn't drink alcohol.    Plan:  1.  Awaiting ANA level. 2.  Advance diet slowly. 3.  Don't see compelling need for cholecystectomy at this time.   4.  Hopefully home tomorrow, will need outpatient endoscopic ultrasound.  Has never had screening colonoscopy, would benefit from that eventually as well. 5.  Will follow.  Landry Dyke 01/01/2014, 9:52 AM

## 2014-01-01 NOTE — Progress Notes (Signed)
NURSING PROGRESS NOTE  Adam Shaw [id number] Discharge Data: 01/01/2014 4:55 PM Attending Provider: Barton Dubois, MD PCP:No PCP Per Patient     [name] to be D/C'd Home per MD order.  Discussed with the patient the After Visit Summary and all questions fully answered. All IV's discontinued with no bleeding noted. All belongings returned to patient for patient to take home.   Last Vital Signs:  Blood pressure 120/78, pulse 62, temperature 98 F (36.7 C), temperature source Oral, resp. rate 18, height 5\' 7"  (1.702 m), weight 56.7 kg (125 lb), SpO2 98.00%.  Discharge Medication List   Medication List         multivitamin with minerals Tabs tablet  Take 1 tablet by mouth daily.     omeprazole 40 MG capsule  Commonly known as:  PRILOSEC  Take 1 capsule (40 mg total) by mouth daily.     oxyCODONE 5 MG immediate release tablet  Commonly known as:  Oxy IR/ROXICODONE  Take 1 tablet (5 mg total) by mouth every 4 (four) hours as needed for severe pain.

## 2014-01-03 ENCOUNTER — Inpatient Hospital Stay (HOSPITAL_COMMUNITY)
Admission: EM | Admit: 2014-01-03 | Discharge: 2014-01-07 | DRG: 417 | Disposition: A | Discharge status: Home / Self Care | Payer: BC Managed Care – PPO | Attending: Internal Medicine | Admitting: Internal Medicine

## 2014-01-03 ENCOUNTER — Inpatient Hospital Stay (HOSPITAL_COMMUNITY): Payer: BC Managed Care – PPO

## 2014-01-03 ENCOUNTER — Encounter (HOSPITAL_COMMUNITY): Payer: Self-pay | Admitting: Emergency Medicine

## 2014-01-03 DIAGNOSIS — R509 Fever, unspecified: Secondary | ICD-10-CM

## 2014-01-03 DIAGNOSIS — K859 Acute pancreatitis without necrosis or infection, unspecified: Secondary | ICD-10-CM | POA: Diagnosis present

## 2014-01-03 DIAGNOSIS — K838 Other specified diseases of biliary tract: Principal | ICD-10-CM | POA: Diagnosis present

## 2014-01-03 DIAGNOSIS — R7989 Other specified abnormal findings of blood chemistry: Secondary | ICD-10-CM | POA: Diagnosis present

## 2014-01-03 DIAGNOSIS — Z79899 Other long term (current) drug therapy: Secondary | ICD-10-CM

## 2014-01-03 DIAGNOSIS — R945 Abnormal results of liver function studies: Secondary | ICD-10-CM

## 2014-01-03 HISTORY — DX: Acute pancreatitis without necrosis or infection, unspecified: K85.90

## 2014-01-03 LAB — LIPASE, BLOOD: Lipase: 1828 U/L — ABNORMAL HIGH (ref 11–59)

## 2014-01-03 LAB — COMPREHENSIVE METABOLIC PANEL
ALT: 57 U/L — ABNORMAL HIGH (ref 0–53)
AST: 89 U/L — AB (ref 0–37)
Albumin: 3.8 g/dL (ref 3.5–5.2)
Alkaline Phosphatase: 149 U/L — ABNORMAL HIGH (ref 39–117)
BUN: 12 mg/dL (ref 6–23)
CO2: 28 meq/L (ref 19–32)
CREATININE: 0.96 mg/dL (ref 0.50–1.35)
Calcium: 9.4 mg/dL (ref 8.4–10.5)
Chloride: 99 mEq/L (ref 96–112)
GFR calc Af Amer: >90 mL/min (ref >90)
Glucose, Bld: 162 mg/dL — ABNORMAL HIGH (ref 70–99)
Potassium: 3.7 mEq/L (ref 3.7–5.3)
SODIUM: 140 meq/L (ref 137–147)
TOTAL PROTEIN: 8.1 g/dL (ref 6.0–8.3)
Total Bilirubin: 0.5 mg/dL (ref 0.3–1.2)

## 2014-01-03 LAB — CBC WITH DIFFERENTIAL/PLATELET
BASOS ABS: 0 10*3/uL (ref 0.0–0.1)
BASOS PCT: 0 % (ref 0–1)
EOS PCT: 0 % (ref 0–5)
Eosinophils Absolute: 0 10*3/uL (ref 0.0–0.7)
HCT: 43.2 % (ref 39.0–52.0)
Hemoglobin: 14.9 g/dL (ref 13.0–17.0)
LYMPHS PCT: 12 % (ref 12–46)
Lymphs Abs: 1.4 10*3/uL (ref 0.7–4.0)
MCH: 28.2 pg (ref 26.0–34.0)
MCHC: 34.5 g/dL (ref 30.0–36.0)
MCV: 81.8 fL (ref 78.0–100.0)
Monocytes Absolute: 0.3 10*3/uL (ref 0.1–1.0)
Monocytes Relative: 2 % — ABNORMAL LOW (ref 3–12)
Neutro Abs: 9.9 10*3/uL — ABNORMAL HIGH (ref 1.7–7.7)
Neutrophils Relative %: 85 % — ABNORMAL HIGH (ref 43–77)
PLATELETS: 206 10*3/uL (ref 150–400)
RBC: 5.28 MIL/uL (ref 4.22–5.81)
RDW: 13.3 % (ref 11.5–15.5)
WBC: 11.6 10*3/uL — AB (ref 4.0–10.5)

## 2014-01-03 LAB — URINALYSIS, ROUTINE W REFLEX MICROSCOPIC
Glucose, UA: NEGATIVE mg/dL
HGB URINE DIPSTICK: NEGATIVE
Ketones, ur: 15 mg/dL — AB
LEUKOCYTES UA: NEGATIVE
Nitrite: NEGATIVE
PH: 6 (ref 5.0–8.0)
Protein, ur: NEGATIVE mg/dL
SPECIFIC GRAVITY, URINE: 1.025 (ref 1.005–1.030)
Urobilinogen, UA: 4 mg/dL — ABNORMAL HIGH (ref 0.0–1.0)

## 2014-01-03 MED ORDER — ONDANSETRON HCL 4 MG PO TABS: 4.0000 mg | ORAL_TABLET | Freq: Four times a day (QID) | ORAL | Status: DC | PRN

## 2014-01-03 MED ORDER — PANTOPRAZOLE SODIUM 40 MG PO TBEC
40.0000 mg | DELAYED_RELEASE_TABLET | Freq: Every day | ORAL | Status: DC
  Administered 2014-01-04 – 2014-01-07 (×3): 40 mg via ORAL
  Filled 2014-01-03 (×3): qty 1

## 2014-01-03 MED ORDER — ONDANSETRON HCL 4 MG/2ML IJ SOLN: 4.0000 mg | Freq: Three times a day (TID) | INTRAMUSCULAR | Status: DC | PRN

## 2014-01-03 MED ORDER — MORPHINE SULFATE 4 MG/ML IJ SOLN: 4.0000 mg | INTRAMUSCULAR | Status: DC | PRN

## 2014-01-03 MED ORDER — SENNOSIDES-DOCUSATE SODIUM 8.6-50 MG PO TABS: 1.0000 | ORAL_TABLET | Freq: Every evening | ORAL | Status: DC | PRN

## 2014-01-03 MED ORDER — HEPARIN SODIUM (PORCINE) 5000 UNIT/ML IJ SOLN
5000.0000 [IU] | Freq: Three times a day (TID) | INTRAMUSCULAR | Status: DC
  Administered 2014-01-03 – 2014-01-06 (×6): 5000 [IU] via SUBCUTANEOUS
  Filled 2014-01-03 (×14): qty 1

## 2014-01-03 MED ORDER — HYDROMORPHONE HCL PF 1 MG/ML IJ SOLN: 1.0000 mg | INTRAMUSCULAR | Status: DC | PRN

## 2014-01-03 MED ORDER — ONDANSETRON HCL 4 MG/2ML IJ SOLN: 4.0000 mg | Freq: Four times a day (QID) | INTRAMUSCULAR | Status: DC | PRN

## 2014-01-03 MED ORDER — OXYCODONE HCL 5 MG PO TABS
5.0000 mg | ORAL_TABLET | ORAL | Status: DC | PRN
  Administered 2014-01-05 – 2014-01-06 (×3): 5 mg via ORAL
  Filled 2014-01-03 (×3): qty 1

## 2014-01-03 MED ORDER — DOCUSATE SODIUM 100 MG PO CAPS
100.0000 mg | ORAL_CAPSULE | Freq: Two times a day (BID) | ORAL | Status: DC
  Filled 2014-01-03 (×3): qty 1

## 2014-01-03 MED ORDER — SODIUM CHLORIDE 0.9 % IV BOLUS (SEPSIS)
1000.0000 mL | Freq: Once | INTRAVENOUS | Status: AC
  Administered 2014-01-03: 1000 mL via INTRAVENOUS

## 2014-01-03 MED ORDER — SODIUM CHLORIDE 0.9 % IV SOLN: INTRAVENOUS | Status: DC

## 2014-01-03 MED ORDER — BISACODYL 10 MG RE SUPP: 10.0000 mg | Freq: Every day | RECTAL | Status: DC | PRN

## 2014-01-03 MED ORDER — SODIUM CHLORIDE 0.9 % IV SOLN
INTRAVENOUS | Status: DC
  Administered 2014-01-03 – 2014-01-04 (×3): via INTRAVENOUS
  Administered 2014-01-04: 1000 mL via INTRAVENOUS
  Administered 2014-01-04: 05:00:00 via INTRAVENOUS
  Administered 2014-01-04: 1000 mL via INTRAVENOUS
  Administered 2014-01-05 – 2014-01-06 (×4): via INTRAVENOUS

## 2014-01-03 MED ORDER — HYDROMORPHONE HCL PF 1 MG/ML IJ SOLN
1.0000 mg | Freq: Once | INTRAMUSCULAR | Status: AC
  Administered 2014-01-03: 1 mg via INTRAVENOUS
  Filled 2014-01-03: qty 1

## 2014-01-03 NOTE — ED Notes (Signed)
Pt transported to ultrasound. Clarene Critchley, RN on 5W is aware of the delay.

## 2014-01-03 NOTE — ED Notes (Signed)
Pt was recently admitted for pancreatitis, discharged on tues. Reports going to work today, ate low fat lunch as instructed and still having onset of severe abd pain. Denies n/v/d.

## 2014-01-03 NOTE — ED Provider Notes (Signed)
CSN: [id number]     Arrival date & time 01/03/14  1512 History   First MD Initiated Contact with Patient 01/03/14 1752     Chief Complaint  Patient presents with  . Abdominal Pain   (Consider location/radiation/quality/duration/timing/severity/associated sxs/prior Treatment) HPI Patient is a 53 yo AA male who presents today with abdominal pain. Patient was admitted to the hospital on 12/29/13 for pancreatitis and discharged on 2/3. During his stay in the hospital he had a thorough work-up done, which included abdominal ultrasound and MRCP- which were both normal aside from a dilated biliary duct without cholecystitis. The etiology of his pancreatitis is unknown. He states the pain today is exactly the same as the pain he experienced on 1/31. He was asymptomatic after discharge from the hospital and felt "back to normal". He did not experience pain until immediately following lunch today. States he has been trying to follow a low fat diet and drink a lot of water to minimize risk of recurrence. Pain is constant and primarily epigastric.He denies fever, chest pain, SOB, N/V/D, burning with urination, and back pain. Was prescribed Omeprazole and Oxycodone after discharge from hospital; says he has been taking the Omeprazole but hasnt had to take the Oxycodone for pain.    Past Medical History  Diagnosis Date  . Pancreatitis    Past Surgical History  Procedure Laterality Date  . Hernia repair     History reviewed. No pertinent family history. History  Substance Use Topics  . Smoking status: Never Smoker   . Smokeless tobacco: Not on file  . Alcohol Use: No    Review of Systems  Constitutional: Negative for fever and chills.  Eyes: Negative for visual disturbance.  Respiratory: Negative for chest tightness and shortness of breath.   Cardiovascular: Negative for chest pain.  Gastrointestinal: Positive for abdominal pain. Negative for nausea, vomiting and diarrhea.       Pain in epigastric  region.   Genitourinary: Negative for hematuria and difficulty urinating.  Musculoskeletal: Negative for back pain.  Neurological: Negative for light-headedness.    Allergies  Review of patient's allergies indicates no known allergies.  Home Medications   Current Outpatient Rx  Name  Route  Sig  Dispense  Refill  . Multiple Vitamin (MULTIVITAMIN WITH MINERALS) TABS tablet   Oral   Take 1 tablet by mouth daily.         Marland Kitchen omeprazole (PRILOSEC) 40 MG capsule   Oral   Take 1 capsule (40 mg total) by mouth daily.   30 capsule   1   . oxyCODONE (OXY IR/ROXICODONE) 5 MG immediate release tablet   Oral   Take 1 tablet (5 mg total) by mouth every 4 (four) hours as needed for severe pain.   20 tablet   0    BP 117/75  Pulse 69  Temp(Src) 97.4 F (36.3 C) (Oral)  Resp 20  SpO2 98% Physical Exam  Constitutional: He is oriented to person, place, and time. He appears well-developed and well-nourished.  HENT:  Head: Normocephalic.  Eyes: Conjunctivae are normal. Pupils are equal, round, and reactive to light.  Cardiovascular: Normal rate, regular rhythm and normal heart sounds.   Pulmonary/Chest: Effort normal and breath sounds normal. No respiratory distress. He exhibits no tenderness.  Abdominal: Soft. Bowel sounds are normal. There is tenderness.  Tenderness to palpation of the epigastric region. Denies tenderness throughout rest of abdomen.   Neurological: He is alert and oriented to person, place, and time.  ED Course  Procedures (including critical care time) Labs Review Labs Reviewed  CBC WITH DIFFERENTIAL - Abnormal; Notable for the following:    WBC 11.6 (*)    Neutrophils Relative % 85 (*)    Neutro Abs 9.9 (*)    Monocytes Relative 2 (*)    All other components within normal limits  COMPREHENSIVE METABOLIC PANEL - Abnormal; Notable for the following:    Glucose, Bld 162 (*)    AST 89 (*)    ALT 57 (*)    Alkaline Phosphatase 149 (*)    All other  components within normal limits  LIPASE, BLOOD - Abnormal; Notable for the following:    Lipase 1828 (*)    All other components within normal limits  URINALYSIS, ROUTINE W REFLEX MICROSCOPIC - Abnormal; Notable for the following:    APPearance HAZY (*)    Bilirubin Urine SMALL (*)    Ketones, ur 15 (*)    Urobilinogen, UA 4.0 (*)    All other components within normal limits   Imaging Review No results found.  EKG Interpretation   None       MDM   1. Pancreatitis    Patients lipase is significantly elevated. His pain is also difficult to control despite IV pain medication.   Due to worsening in labs and difficulty controlling pain will admit to medicine.  Inpatient, [facility], [place], [place]    Linus Mako, PA-C 01/03/14 1925

## 2014-01-03 NOTE — Progress Notes (Signed)
Pt arrive to unit in no s/s of distress. Wife at bedside. Charge nurse receive report from ED nurse over the phone. VS stable. Pt oriented to room. Whiteboard udpated. Callbell within reach. Will continue to monitor.

## 2014-01-03 NOTE — ED Notes (Signed)
Admitting MD at bedside.

## 2014-01-03 NOTE — ED Provider Notes (Signed)
Medical screening examination/treatment/procedure(s) were conducted as a shared visit with non-physician practitioner(s) and myself.  I personally evaluated the patient during the encounter.  Epigastric abdominal pain with nausea onset today after eating, feels similar to recently diagnosed pancreatitis. TTP epigastrium without peritoneal signs.    Ezequiel Essex, MD 01/03/14 2129

## 2014-01-03 NOTE — H&P (Signed)
Patient Demographics  Adam Shaw, is a 53 y.o. male  MRN: [id number]   DOB - 11-25-1961  Admit Date - 01/03/2014  Outpatient Primary MD for the patient is No PCP Per Patient   With History of -  Past Medical History  Diagnosis Date  . Pancreatitis       Past Surgical History  Procedure Laterality Date  . Hernia repair      in for   Chief Complaint  Patient presents with  . Abdominal Pain     HPI  Adam Shaw  is a 53 y.o. male, who was recently discharged from [facility] on February 3, for acute pancreatitis, patient had extensive workup including a negative workup for gallstones, and unremarkable MRCP, and within normal limits blood workup including WNL IgG, IgA, IgM and ANA, the patient was discharged after resolution of his abdomen the pain, and when his lipase basically came back within normal limit, patient presents today with complaints of abdominal pain mild nausea but no vomiting, patient had significantly elevated lipase level, as well he denies any alcohol use, hospitalist service requested to admit the patient for further workup, his LFTs were mildly elevated this time.     Review of Systems    In addition to the HPI above,  No Fever-chills, No Headache, No changes with Vision or hearing, No problems swallowing food or Liquids, No Chest pain, Cough or Shortness of Breath, Complaints of Abdominal pain, and Nausea but no Vommitting, Bowel movements are regular, No Blood in stool or Urine, No dysuria, No new skin rashes or bruises, No new joints pains-aches,  No new weakness, tingling, numbness in any extremity, No recent weight gain or loss, No polyuria, polydypsia or polyphagia, No significant Mental Stressors.  A full 10 point Review of Systems was done, except as  stated above, all other Review of Systems were negative.   Social History History  Substance Use Topics  . Smoking status: Never Smoker   . Smokeless tobacco: Not on file  . Alcohol Use: No     Family History History reviewed. No pertinent family history.   Prior to Admission medications   Medication Sig Start Date End Date Taking? Authorizing Provider  Multiple Vitamin (MULTIVITAMIN WITH MINERALS) TABS tablet Take 1 tablet by mouth daily.   Yes Historical Provider, MD  omeprazole (PRILOSEC) 40 MG capsule Take 1 capsule (40 mg total) by mouth daily. 01/01/14  Yes Barton Dubois, MD  oxyCODONE (OXY IR/ROXICODONE) 5 MG immediate release tablet Take 1 tablet (5 mg total) by mouth every 4 (four) hours as needed for severe pain. 01/01/14  Yes Barton Dubois, MD    No Known Allergies  Physical Exam  Vitals  Blood pressure 117/72, pulse 64, temperature 97.4 F (36.3 C), temperature source Oral, resp. rate 19, SpO2 97.00%.   1. General well nourished male lying in bed in NAD,    2. Normal affect and insight, Not Suicidal or  Homicidal, Awake Alert, Oriented X 3.  3. No F.N deficits, ALL C.Nerves Intact, Strength 5/5 all 4 extremities, Sensation intact all 4 extremities, Plantars down going.  4. Ears and Eyes appear Normal, Conjunctivae clear, PERRLA. Moist Oral Mucosa.  5. Supple Neck, No JVD, No cervical lymphadenopathy appriciated, No Carotid Bruits.  6. Symmetrical Chest wall movement, Good air movement bilaterally, CTAB.  7. RRR, No Gallops, Rubs or Murmurs, No Parasternal Heave.  8. Positive Bowel Sounds, Abdomen Soft, minimal tenderness to palpation in the epigastric area,No rebound -guarding or rigidity.  9.  No Cyanosis, Normal Skin Turgor, No Skin Rash or Bruise.  10. Good muscle tone,  joints appear normal , no effusions, Normal ROM.  11. No Palpable Lymph Nodes in Neck or Axillae    Data Review  CBC  Recent Labs Lab 12/31/13 0103 01/01/14 0531  01/03/14 1800  WBC 8.5 7.1 11.6*  HGB 13.6 13.2 14.9  HCT 41.1 39.5 43.2  PLT 167 174 206  MCV 82.2 82.6 81.8  MCH 27.2 27.6 28.2  MCHC 33.1 33.4 34.5  RDW 13.6 13.5 13.3  LYMPHSABS 2.5  --  1.4  MONOABS 0.5  --  0.3  EOSABS 0.1  --  0.0  BASOSABS 0.0  --  0.0   ------------------------------------------------------------------------------------------------------------------  Chemistries   Recent Labs Lab 12/31/13 0103 01/01/14 0531 01/03/14 1800  NA 140 142 140  K 3.9 4.2 3.7  CL 102 108 99  CO2 26 24 28   GLUCOSE 177* 91 162*  BUN 19 8 12   CREATININE 0.95 0.95 0.96  CALCIUM 8.8 8.3* 9.4  AST 49* 18 89*  ALT 29 20 57*  ALKPHOS 99 92 149*  BILITOT 0.3 0.5 0.5   ------------------------------------------------------------------------------------------------------------------ CrCl is unknown because both a height and weight (above a minimum accepted value) are required for this calculation. ------------------------------------------------------------------------------------------------------------------ No results found for this basename: TSH, T4TOTAL, FREET3, T3FREE, THYROIDAB,  in the last 72 hours   Coagulation profile No results found for this basename: INR, PROTIME,  in the last 168 hours ------------------------------------------------------------------------------------------------------------------- No results found for this basename: DDIMER,  in the last 72 hours -------------------------------------------------------------------------------------------------------------------  Cardiac Enzymes No results found for this basename: CK, CKMB, TROPONINI, MYOGLOBIN,  in the last 168 hours ------------------------------------------------------------------------------------------------------------------ No components found with this basename: POCBNP,     ---------------------------------------------------------------------------------------------------------------  Urinalysis    Component Value Date/Time   COLORURINE YELLOW 01/03/2014 1748   APPEARANCEUR HAZY* 01/03/2014 1748   LABSPEC 1.025 01/03/2014 1748   PHURINE 6.0 01/03/2014 [place] 01/03/2014 [place] 01/03/2014 [place]* 01/03/2014 1748   KETONESUR 15* 01/03/2014 [place] 01/03/2014 1748   UROBILINOGEN 4.0* 01/03/2014 1748   NITRITE NEGATIVE 01/03/2014 [place] 01/03/2014 1748    ----------------------------------------------------------------------------------------------------------------  Imaging results:   Dg Chest 2 View  12/31/2013   CLINICAL DATA:  Epigastric pain and bloating.  EXAM: CHEST  2 VIEW  COMPARISON:  None available for comparison at time of study interpretation.  FINDINGS: Cardiomediastinal silhouette is unremarkable. The lungs are clear without pleural effusions or focal consolidations. Trace biapical pleural thickening. Trachea projects midline and there is no pneumothorax. Soft tissue planes and included osseous structures are non-suspicious. Multiple EKG lines overlie the patient and may obscure subtle underlying pathology.  IMPRESSION: No active cardiopulmonary disease.   Electronically Signed   By: Elon Alas   On: 12/31/2013 03:11   US Abdomen Complete  12/31/2013   CLINICAL  DATA:  Acute pancreatitis  EXAM: ULTRASOUND ABDOMEN COMPLETE  COMPARISON:  None.  FINDINGS: Gallbladder:  There is thickening of the gallbladder wall to 4 mm, with echogenic intramural foci with ring down artifact. No stone is seen. No focal tenderness.  Common bile duct:  Diameter: Measures up to 9 mm. No visible filling defect. The it is noted that there is no cholestasis on contemporaneously labs.  Liver:  No focal lesion identified. Within normal limits in parenchymal echogenicity.  IVC:  No abnormality  visualized.  Pancreas:  Visualized portion unremarkable.  Spleen:  Size and appearance within normal limits.  Right Kidney:  Length: 9 cm. 2 cm peripelvic, simple appearing cyst. No hydronephrosis.  Left Kidney:  Length: 9 cm. Echogenicity within normal limits. No mass or hydronephrosis visualized.  Abdominal aorta:  No aneurysm visualized.  Other findings:  None.  IMPRESSION: 1. No cholelithiasis. 2. Enlargement of the common bile duct to 9 mm. No visible choledocholithiasis. 3. Adenomyomatosis. 4. 2 cm right renal cyst.   Electronically Signed   By: Jorje Guild M.D.   On: 12/31/2013 05:41   Mr 3d Recon At Scanner  12/31/2013   CLINICAL DATA:  Epigastric abdominal pain. Dilated common bile duct on ultrasound.  EXAM: MRI ABDOMEN WITHOUT AND WITH CONTRAST (INCLUDING MRCP)  TECHNIQUE: Multiplanar multisequence MR imaging of the abdomen was performed both before and after the administration of intravenous contrast. Heavily T2-weighted images of the biliary and pancreatic ducts were obtained, and three-dimensional MRCP images were rendered by post processing.  CONTRAST:  24mL MULTIHANCE GADOBENATE DIMEGLUMINE 529 MG/ML IV SOLN  COMPARISON:  MR MRCP WO/W CM dated 12/31/2013; US ABDOMEN COMPLETE dated 12/31/2013  FINDINGS: Probable volume loss and atelectasis the anterior right lung base. Normal liver, spleen. Underdistended proximal stomach.  Normal gallbladder. Normal pancreas, without pancreatic ductal dilatation.  The intrahepatic ducts are minimally dilated, including a 4 mm left hepatic duct on image 75/series 11. The common duct measures 7 mm maximally in the porta hepatis on image 67/series 11. Upper normal 6 mm in this age group. Tapers to 5 mm in the region of the pancreatic head. No evidence of obstructive stone or mass.  Normal adrenal glands. Tiny bilateral renal cysts with a cyst or minimally complex cyst in the interpolar right kidney measuring 1.6 cm.  No abdominal adenopathy or ascites.  IMPRESSION: 1.  Minimal intra and extrahepatic biliary ductal dilatation, without evidence of obstructive stone or mass. This could be within normal variation for this patient. If there is a high clinical concern of otherwise occult ampullary stenosis or ampullary lesion, ERCP should be considered. 2. No other explanation for epigastric abdominal pain.   Electronically Signed   By: Abigail Miyamoto M.D.   On: 12/31/2013 13:33   Mr Jeananne Rama W/wo Cm/mrcp  12/31/2013   CLINICAL DATA:  Epigastric abdominal pain. Dilated common bile duct on ultrasound.  EXAM: MRI ABDOMEN WITHOUT AND WITH CONTRAST (INCLUDING MRCP)  TECHNIQUE: Multiplanar multisequence MR imaging of the abdomen was performed both before and after the administration of intravenous contrast. Heavily T2-weighted images of the biliary and pancreatic ducts were obtained, and three-dimensional MRCP images were rendered by post processing.  CONTRAST:  31mL MULTIHANCE GADOBENATE DIMEGLUMINE 529 MG/ML IV SOLN  COMPARISON:  MR MRCP WO/W CM dated 12/31/2013; US ABDOMEN COMPLETE dated 12/31/2013  FINDINGS: Probable volume loss and atelectasis the anterior right lung base. Normal liver, spleen. Underdistended proximal stomach.  Normal gallbladder. Normal pancreas, without pancreatic ductal dilatation.  The intrahepatic ducts  are minimally dilated, including a 4 mm left hepatic duct on image 75/series 11. The common duct measures 7 mm maximally in the porta hepatis on image 67/series 11. Upper normal 6 mm in this age group. Tapers to 5 mm in the region of the pancreatic head. No evidence of obstructive stone or mass.  Normal adrenal glands. Tiny bilateral renal cysts with a cyst or minimally complex cyst in the interpolar right kidney measuring 1.6 cm.  No abdominal adenopathy or ascites.  IMPRESSION: 1. Minimal intra and extrahepatic biliary ductal dilatation, without evidence of obstructive stone or mass. This could be within normal variation for this patient. If there is a high clinical  concern of otherwise occult ampullary stenosis or ampullary lesion, ERCP should be considered. 2. No other explanation for epigastric abdominal pain.   Electronically Signed   By: Abigail Miyamoto M.D.   On: 12/31/2013 13:33        Assessment & Plan  Active Problems:   Pancreatitis   Elevated LFTs    1. Pancreatitis: Appears to be recurrent, so far etiology is unclear, as he had negative workup last time, including MRCP, ultrasound, autoimmune workup, normal lipid profile, will repeat ultrasound as this time he seems to be having elevated LFTs, will keep him n.p.o., we'll continue with aggressive IV fluid hydration, will keep him on when necessary nausea and pain medicine, would have morning team consult gastroenterology. We will recheck lipase level in a.m.  2_elevated LFTs: We will repeat right upper quadrant ultrasound, this is most likely related to his pancreatitis.   DVT Prophylaxis Heparin   AM Labs Ordered, also please review Full Orders  Family Communication: Admission, patients condition and plan of care including tests being ordered have been discussed with the patient and familywho indicate understanding and agree with the plan and Code Status.  Code Status full  Likely DC to  home  Condition GUARDED  Time spent in minutes : 71min    ELGERGAWY, DAWOOD M.D on 01/03/2014 at 8:04 PM  Between 7am to 7pm -  After 7pm go to [email or web address] - password [email or web address]  And look for the night coverage person covering me after hours  [facility] Office  [phone]

## 2014-01-04 ENCOUNTER — Encounter (HOSPITAL_COMMUNITY)
Admission: EM | Disposition: A | Discharge status: Home / Self Care | Payer: BC Managed Care – PPO | Source: Home / Self Care | Attending: Internal Medicine

## 2014-01-04 ENCOUNTER — Ambulatory Visit (HOSPITAL_COMMUNITY): Payer: BC Managed Care – PPO

## 2014-01-04 ENCOUNTER — Inpatient Hospital Stay (HOSPITAL_COMMUNITY): Payer: BC Managed Care – PPO | Admitting: Anesthesiology

## 2014-01-04 ENCOUNTER — Encounter (HOSPITAL_COMMUNITY): Payer: Self-pay

## 2014-01-04 ENCOUNTER — Encounter (HOSPITAL_COMMUNITY): Payer: BC Managed Care – PPO | Admitting: Anesthesiology

## 2014-01-04 HISTORY — PX: EUS: SHX5427

## 2014-01-04 LAB — COMPREHENSIVE METABOLIC PANEL
ALT: 34 U/L (ref 0–53)
AST: 29 U/L (ref 0–37)
Albumin: 2.7 g/dL — ABNORMAL LOW (ref 3.5–5.2)
Alkaline Phosphatase: 106 U/L (ref 39–117)
BUN: 8 mg/dL (ref 6–23)
CALCIUM: 8 mg/dL — AB (ref 8.4–10.5)
CO2: 23 mEq/L (ref 19–32)
Chloride: 108 mEq/L (ref 96–112)
Creatinine, Ser: 0.92 mg/dL (ref 0.50–1.35)
GFR calc non Af Amer: >90 mL/min (ref >90)
GLUCOSE: 91 mg/dL (ref 70–99)
Potassium: 3.9 mEq/L (ref 3.7–5.3)
SODIUM: 142 meq/L (ref 137–147)
TOTAL PROTEIN: 6 g/dL (ref 6.0–8.3)
Total Bilirubin: 0.5 mg/dL (ref 0.3–1.2)

## 2014-01-04 LAB — CBC
HEMATOCRIT: 36.7 % — AB (ref 39.0–52.0)
Hemoglobin: 12.4 g/dL — ABNORMAL LOW (ref 13.0–17.0)
MCH: 27.6 pg (ref 26.0–34.0)
MCHC: 33.8 g/dL (ref 30.0–36.0)
MCV: 81.6 fL (ref 78.0–100.0)
Platelets: 162 10*3/uL (ref 150–400)
RBC: 4.5 MIL/uL (ref 4.22–5.81)
RDW: 13.5 % (ref 11.5–15.5)
WBC: 7.6 10*3/uL (ref 4.0–10.5)

## 2014-01-04 LAB — LIPASE, BLOOD: Lipase: 236 U/L — ABNORMAL HIGH (ref 11–59)

## 2014-01-04 SURGERY — ESOPHAGEAL ENDOSCOPIC ULTRASOUND (EUS) RADIAL
Anesthesia: Monitor Anesthesia Care

## 2014-01-04 MED ORDER — MORPHINE SULFATE 2 MG/ML IJ SOLN: 2.0000 mg | INTRAMUSCULAR | Status: DC | PRN

## 2014-01-04 MED ORDER — POLYETHYLENE GLYCOL 3350 17 G PO PACK
17.0000 g | PACK | Freq: Every day | ORAL | Status: DC
  Administered 2014-01-06 – 2014-01-07 (×2): 17 g via ORAL
  Filled 2014-01-04 (×4): qty 1

## 2014-01-04 MED ORDER — SODIUM CHLORIDE 0.9 % IV SOLN: INTRAVENOUS | Status: DC

## 2014-01-04 MED ORDER — PROPOFOL 10 MG/ML IV EMUL
INTRAVENOUS | Status: DC | PRN
  Administered 2014-01-04: 20 mg via INTRAVENOUS
  Administered 2014-01-04: 40 mg via INTRAVENOUS
  Administered 2014-01-04: 20 mg via INTRAVENOUS

## 2014-01-04 MED ORDER — PROPOFOL INFUSION 10 MG/ML OPTIME
INTRAVENOUS | Status: DC | PRN
  Administered 2014-01-04: 100 ug/kg/min via INTRAVENOUS

## 2014-01-04 MED ORDER — KETAMINE HCL 10 MG/ML IJ SOLN
INTRAMUSCULAR | Status: DC | PRN
Start: 2014-01-04 — End: 2014-01-04
  Administered 2014-01-04: 20 mg via INTRAVENOUS

## 2014-01-04 MED ORDER — BUTAMBEN-TETRACAINE-BENZOCAINE 2-2-14 % EX AERO
INHALATION_SPRAY | CUTANEOUS | Status: DC | PRN
  Administered 2014-01-04: 2 via TOPICAL

## 2014-01-04 NOTE — Progress Notes (Signed)
Utilization review completed. Barett Whidbee, RN, BSN. 

## 2014-01-04 NOTE — Transfer of Care (Signed)
Immediate Anesthesia Transfer of Care Note  Patient: Adam Shaw  Procedure(s) Performed: Procedure(s): ESOPHAGEAL ENDOSCOPIC ULTRASOUND (EUS) RADIAL (N/A)  Patient Location: PACU  Anesthesia Type:MAC  Level of Consciousness: awake, alert  and oriented  Airway & Oxygen Therapy: Patient Spontanous Breathing and Patient connected to nasal cannula oxygen  Post-op Assessment: Report given to PACU RN and Post -op Vital signs reviewed and stable  Post vital signs: Reviewed and stable  Complications: No apparent anesthesia complications

## 2014-01-04 NOTE — Interval H&P Note (Signed)
History and Physical Interval Note:  01/04/2014 2:13 PM  Adam Shaw  has presented today for surgery, with the diagnosis of evaluate for CBD stone  The various methods of treatment have been discussed with the patient and family. After consideration of risks, benefits and other options for treatment, the patient has consented to  Procedure(s): ESOPHAGEAL ENDOSCOPIC ULTRASOUND (EUS) RADIAL (N/A) as a surgical intervention .  The patient's history has been reviewed, patient examined, no change in status, stable for surgery.  I have reviewed the patient's chart and labs.  Questions were answered to the patient's satisfaction.     Dianna Deshler M  Assessment:  1.  Recurrent pancreatitis.  Query choledocholithiasis-related 2.  Elevated LFTs, normalized.  Plan:  1.  Endoscopic ultrasound. 2.  Risks (bleeding, infection, bowel perforation that could require surgery, sedation-related changes in cardiopulmonary systems), benefits (identification and possible treatment of source of symptoms, exclusion of certain causes of symptoms), and alternatives (watchful waiting, radiographic imaging studies, empiric medical treatment) of upper endoscopy with ultrasound (EUS) were explained to patient/family in detail and patient wishes to proceed.

## 2014-01-04 NOTE — Progress Notes (Signed)
Patient well-known to me.  Presents again with abdominal pain, with second documented attack of pancreatitis within the past one week.  ALP and AST a bit more elevated than last time, but have normalized.  Lipase level 1800-->230.  His pain has essentially resolved.  Despite prior negative imaging, I suspect he either has or has passed a bile duct stone.   Assessment:  1.  Recurrent acute pancreatitis.  Concern for choledocholithiasis.  Plan:  1.  Endoscopic ultrasound today to check for gallstones, microlithiasis, choledocholithiasis.

## 2014-01-04 NOTE — Progress Notes (Signed)
[facility] PROGRESS NOTE   Adam Shaw TGG:[id number] DOB: [birth year] DOA: January 09, 2014 PCP: No PCP Per Patient  HPI/Subjective: Patient is much better than yesterday, but still has some nausea.  Assessment/Plan: Active Problems:   Pancreatitis   Elevated LFTs   Acute pancreatitis -Recurrent acute pancreatitis, patient was recently discharged from the hospital. -Recent extensive workup with MRCP, ultrasound, autoimmune workup and a lipid profile showed normal findings. -GI evaluated him and they recommended an outpatient endoscopic ultrasound. -Repeat RUQ ultrasound showed no acute findings. -Continue bowel rest.  Elevated LFTs -Elevated AST/ALT and alkaline phosphatase, resolved overnight. - I notified GI, if they wants to consider endoscopic [place] during this hospital stay.  Code Status: Full code Family Communication: Plan discussed with the patient. Disposition Plan: Remains inpatient   Consultants:  None  Procedures:  None  Antibiotics:  None   Objective: Filed Vitals:   01/04/14 0511  BP: 118/75  Pulse: 87  Temp: 98.2 F (36.8 C)  Resp: 16    Intake/Output Summary (Last 24 hours) at 01/04/14 1026 Last data filed at 01/04/14 1000  Gross per 24 hour  Intake 1752.5 ml  Output      0 ml  Net 1752.5 ml   Filed Weights   01-09-14 2105  Weight: 58.015 kg (127 lb 14.4 oz)    Exam: General: Alert and awake, oriented x3, not in any acute distress. HEENT: anicteric sclera, pupils reactive to light and accommodation, EOMI CVS: S1-S2 clear, no murmur rubs or gallops Chest: clear to auscultation bilaterally, no wheezing, rales or rhonchi Abdomen: soft nontender, nondistended, normal bowel sounds, no organomegaly Extremities: no cyanosis, clubbing or edema noted bilaterally Neuro: Cranial nerves II-XII intact, no focal neurological deficits  Data Reviewed: Basic Metabolic Panel:  Recent Labs Lab 12/31/13 0103 01/01/14 0531 09-Jan-2014 1800  01/04/14 0553  NA 140 142 140 142  K 3.9 4.2 3.7 3.9  CL 102 108 99 108  CO2 26 24 28 23   GLUCOSE 177* 91 162* 91  BUN 19 8 12 8   CREATININE 0.95 0.95 0.96 0.92  CALCIUM 8.8 8.3* 9.4 8.0*   Liver Function Tests:  Recent Labs Lab 12/31/13 0103 01/01/14 0531 01-09-14 1800 01/04/14 0553  AST 49* 18 89* 29  ALT 29 20 57* 34  ALKPHOS 99 92 149* 106  BILITOT 0.3 0.5 0.5 0.5  PROT 7.1 6.1 8.1 6.0  ALBUMIN 3.3* 2.8* 3.8 2.7*    Recent Labs Lab 12/31/13 0103 01/01/14 0531 09-Jan-2014 1800 01/04/14 0553  LIPASE 718* 24 1828* 236*   No results found for this basename: AMMONIA,  in the last 168 hours CBC:  Recent Labs Lab 12/31/13 0103 01/01/14 0531 01/09/2014 1800 01/04/14 0553  WBC 8.5 7.1 11.6* 7.6  NEUTROABS 5.4  --  9.9*  --   HGB 13.6 13.2 14.9 12.4*  HCT 41.1 39.5 43.2 36.7*  MCV 82.2 82.6 81.8 81.6  PLT 167 174 206 162   Cardiac Enzymes: No results found for this basename: CKTOTAL, CKMB, CKMBINDEX, TROPONINI,  in the last 168 hours BNP (last 3 results) No results found for this basename: PROBNP,  in the last 8760 hours CBG: No results found for this basename: GLUCAP,  in the last 168 hours  Micro No results found for this or any previous visit (from the past 240 hour(s)).   Studies: US Abdomen Limited Ruq  2014-01-09   CLINICAL DATA:  54 year old male with recurrent pancreatitis. Epigastric pain. Initial encounter. Biliary ductal dilatation on recent exams.  EXAM: US  ABDOMEN LIMITED - RIGHT UPPER QUADRANT  COMPARISON:  MRCP 12/31/2013.  Ultrasound of the abdomen 12/31/2013.  FINDINGS: Gallbladder:  Wall thickening up to 3 mm, not significantly changed. Evidence of adenomyomatosis, but was more apparent on the 12/31/2013 [place]. No pericholecystic fluid. No sonographic Murphy sign elicited.  Common bile duct:  Diameter: Up to 8 mm diameter today, previously 8-9 mm by ultrasound and MRCP. Suggestion of mild wall thickening of the CBD, unchanged.  Liver:  Stable to  decreased mild intrahepatic ductal dilatation. No discrete liver lesion. No perihepatic fluid.  Pancreas: No pancreatic ductal enlargement. Parenchymal echotexture within normal limits.  IMPRESSION: 1. Essentially stable since the MRCP on 12/31/2013; intra and extrahepatic biliary ductal enlargement is stable to mildly decreased. 2. Gallbladder adenomyomatosis. 3. Negative sonographic appearance of the pancreas.   Electronically Signed   By: Lars Pinks M.D.   On: 01/03/2014 20:23    Scheduled Meds: . docusate sodium  100 mg Oral BID  . heparin  5,000 Units Subcutaneous Q8H  . pantoprazole  40 mg Oral Daily   Continuous Infusions: . sodium chloride 150 mL/hr at 01/04/14 0506       Time spent: 35 minutes    [facility] A  [facility] Pager [phone] If 7PM-7AM, please contact night-coverage at [email or web address], password [facility] 01/04/2014, 10:26 AM  LOS: 1 day

## 2014-01-04 NOTE — Preoperative (Signed)
Beta Blockers   Reason not to administer Beta Blockers:Not Applicable 

## 2014-01-04 NOTE — H&P (View-Only) (Signed)
[facility] PROGRESS NOTE   Adam Shaw TGG:[id number] DOB: [birth year] DOA: January 09, 2014 PCP: No PCP Per Patient  HPI/Subjective: Patient is much better than yesterday, but still has some nausea.  Assessment/Plan: Active Problems:   Pancreatitis   Elevated LFTs   Acute pancreatitis -Recurrent acute pancreatitis, patient was recently discharged from the hospital. -Recent extensive workup with MRCP, ultrasound, autoimmune workup and a lipid profile showed normal findings. -GI evaluated him and they recommended an outpatient endoscopic ultrasound. -Repeat RUQ ultrasound showed no acute findings. -Continue bowel rest.  Elevated LFTs -Elevated AST/ALT and alkaline phosphatase, resolved overnight. - I notified GI, if they wants to consider endoscopic [place] during this hospital stay.  Code Status: Full code Family Communication: Plan discussed with the patient. Disposition Plan: Remains inpatient   Consultants:  None  Procedures:  None  Antibiotics:  None   Objective: Filed Vitals:   01/04/14 0511  BP: 118/75  Pulse: 87  Temp: 98.2 F (36.8 C)  Resp: 16    Intake/Output Summary (Last 24 hours) at 01/04/14 1026 Last data filed at 01/04/14 1000  Gross per 24 hour  Intake 1752.5 ml  Output      0 ml  Net 1752.5 ml   Filed Weights   01-09-14 2105  Weight: 58.015 kg (127 lb 14.4 oz)    Exam: General: Alert and awake, oriented x3, not in any acute distress. HEENT: anicteric sclera, pupils reactive to light and accommodation, EOMI CVS: S1-S2 clear, no murmur rubs or gallops Chest: clear to auscultation bilaterally, no wheezing, rales or rhonchi Abdomen: soft nontender, nondistended, normal bowel sounds, no organomegaly Extremities: no cyanosis, clubbing or edema noted bilaterally Neuro: Cranial nerves II-XII intact, no focal neurological deficits  Data Reviewed: Basic Metabolic Panel:  Recent Labs Lab 12/31/13 0103 01/01/14 0531 09-Jan-2014 1800  01/04/14 0553  NA 140 142 140 142  K 3.9 4.2 3.7 3.9  CL 102 108 99 108  CO2 26 24 28 23   GLUCOSE 177* 91 162* 91  BUN 19 8 12 8   CREATININE 0.95 0.95 0.96 0.92  CALCIUM 8.8 8.3* 9.4 8.0*   Liver Function Tests:  Recent Labs Lab 12/31/13 0103 01/01/14 0531 01-09-14 1800 01/04/14 0553  AST 49* 18 89* 29  ALT 29 20 57* 34  ALKPHOS 99 92 149* 106  BILITOT 0.3 0.5 0.5 0.5  PROT 7.1 6.1 8.1 6.0  ALBUMIN 3.3* 2.8* 3.8 2.7*    Recent Labs Lab 12/31/13 0103 01/01/14 0531 09-Jan-2014 1800 01/04/14 0553  LIPASE 718* 24 1828* 236*   No results found for this basename: AMMONIA,  in the last 168 hours CBC:  Recent Labs Lab 12/31/13 0103 01/01/14 0531 01/09/2014 1800 01/04/14 0553  WBC 8.5 7.1 11.6* 7.6  NEUTROABS 5.4  --  9.9*  --   HGB 13.6 13.2 14.9 12.4*  HCT 41.1 39.5 43.2 36.7*  MCV 82.2 82.6 81.8 81.6  PLT 167 174 206 162   Cardiac Enzymes: No results found for this basename: CKTOTAL, CKMB, CKMBINDEX, TROPONINI,  in the last 168 hours BNP (last 3 results) No results found for this basename: PROBNP,  in the last 8760 hours CBG: No results found for this basename: GLUCAP,  in the last 168 hours  Micro No results found for this or any previous visit (from the past 240 hour(s)).   Studies: US Abdomen Limited Ruq  2014-01-09   CLINICAL DATA:  53 year old male with recurrent pancreatitis. Epigastric pain. Initial encounter. Biliary ductal dilatation on recent exams.  EXAM: US  ABDOMEN LIMITED - RIGHT UPPER QUADRANT  COMPARISON:  MRCP 12/31/2013.  Ultrasound of the abdomen 12/31/2013.  FINDINGS: Gallbladder:  Wall thickening up to 3 mm, not significantly changed. Evidence of adenomyomatosis, but was more apparent on the 12/31/2013 US. No pericholecystic fluid. No sonographic Murphy sign elicited.  Common bile duct:  Diameter: Up to 8 mm diameter today, previously 8-9 mm by ultrasound and MRCP. Suggestion of mild wall thickening of the CBD, unchanged.  Liver:  Stable to  decreased mild intrahepatic ductal dilatation. No discrete liver lesion. No perihepatic fluid.  Pancreas: No pancreatic ductal enlargement. Parenchymal echotexture within normal limits.  IMPRESSION: 1. Essentially stable since the MRCP on 12/31/2013; intra and extrahepatic biliary ductal enlargement is stable to mildly decreased. 2. Gallbladder adenomyomatosis. 3. Negative sonographic appearance of the pancreas.   Electronically Signed   By: Lee  Hall M.D.   On: 01/03/2014 20:23    Scheduled Meds: . docusate sodium  100 mg Oral BID  . heparin  5,000 Units Subcutaneous Q8H  . pantoprazole  40 mg Oral Daily   Continuous Infusions: . sodium chloride 150 mL/hr at 01/04/14 0506       Time spent: 35 minutes    [name]  [facility] Pager [phone] If 7PM-7AM, please contact night-coverage at [email or web address], password [email or web address] 01/04/2014, 10:26 AM  LOS: 1 day               

## 2014-01-04 NOTE — Op Note (Signed)
[facility] [place], [address]   ENDOSCOPIC ULTRASOUND PROCEDURE REPORT  PATIENT: Adam Shaw, Adam Shaw  MR#: [id number] BIRTHDATE: [birth year]  GENDER: Male ENDOSCOPIST: Arta Silence, MD REFERRED BY:  [facility] PROCEDURE DATE:  01/04/2014 PROCEDURE:   Upper EUS ASA CLASS:      Class II INDICATIONS:   1.  recurrent pancreatitis, elevated LFTs. MEDICATIONS: MAC sedation, administered by CRNA and Cetacaine spray x 2  DESCRIPTION OF PROCEDURE:   After the risks benefits and alternatives of the procedure were  explained, informed consent was obtained. The patient was then placed in the left, lateral, decubitus postion and IV sedation was administered. Throughout the procedure, the patients blood pressure, pulse and oxygen saturations were monitored continuously.  Under direct visualization, the radial forward-viewing echoendoscope was introduced through the mouth  and advanced to the second portion of the duodenum .  Water was used as necessary to provide an acoustic interface.  Upon completion of the imaging, water was removed and the patient was sent to the recovery room in satisfactory condition.    FINDINGS:      Normal-appearing pancreas; no features of acute or chronic pancreatitis.  Few diminutive peripancreatic triangular benign-appearing lymph nodes surrounding head/uncinate pancreas, highly likely reactive in nature.  Bile duct about 38mm maximally, some symmetrical wall thickening noted; no choledocholithiasis was seen.  Normal-appearing ampulla via EUS.  Gallbladder had symmetrical wall thickening with some irregular nodularity intramurally, consistent with adenmyomatosis, and which was seen on transabdominal ultrasound.  However, I also noted lots of sludge and small stone fragments within the gallbladder lumen, features of which were not noted on the patient's recent abdominal ultrasound.   IMPRESSION:     As above.  Patient's  recent bouts of pancreatitis are highly likely gallbladder-mediated (gallstones + sludge). Patient's LFTs have again normalized and there was no choledocholithiasis via EUS.  RECOMMENDATIONS:     1.  Watch for potential complications of procedure. 2.  Medical management of pancreatitis (IV fluids, judicious analgesics, cautious trial of clear liquids). 3.  Patient's pancreatitis seems to have been of rapid onset and is rapidly improving.  In light of this, as well as the fact that this is his second attack of pancreatitis within the past one week, surgical consult has been called for consideration of inpatient cholecystectomy. 4.  [facility] inpatient team to follow.   _______________________________ Lorrin MaisArta Silence, MD 01/04/2014 2:53 PM   CC:

## 2014-01-04 NOTE — Anesthesia Preprocedure Evaluation (Addendum)
Anesthesia Evaluation  Patient identified by MRN, date of birth, ID band Patient awake    Reviewed: Allergy & Precautions, H&P , NPO status , Patient's Chart, lab work & pertinent test results  Airway Mallampati: II TM Distance: >3 FB Neck ROM: full    Dental no notable dental hx. (+) Teeth Intact and Dental Advisory Given   Pulmonary neg pulmonary ROS,  breath sounds clear to auscultation  Pulmonary exam normal       Cardiovascular Exercise Tolerance: Good negative cardio ROS  Rhythm:regular Rate:Normal     Neuro/Psych negative neurological ROS  negative psych ROS   GI/Hepatic negative GI ROS, Neg liver ROS, pancreatitis   Endo/Other  negative endocrine ROS  Renal/GU negative Renal ROS  negative genitourinary   Musculoskeletal   Abdominal   Peds  Hematology negative hematology ROS (+)   Anesthesia Other Findings   Reproductive/Obstetrics negative OB ROS                          Anesthesia Physical Anesthesia Plan  ASA: II  Anesthesia Plan: MAC   Post-op Pain Management:    Induction:   Airway Management Planned: Nasal Cannula  Additional Equipment:   Intra-op Plan:   Post-operative Plan:   Informed Consent: I have reviewed the patients History and Physical, chart, labs and discussed the procedure including the risks, benefits and alternatives for the proposed anesthesia with the patient or authorized representative who has indicated his/her understanding and acceptance.   Dental Advisory Given  Plan Discussed with: CRNA and Surgeon  Anesthesia Plan Comments:        Anesthesia Quick Evaluation

## 2014-01-04 NOTE — Care Management Note (Signed)
    Page 1 of 1   01/07/2014     3:23:47 PM   CARE MANAGEMENT NOTE 01/07/2014  Patient:  Adam Shaw,Adam Shaw   Account Number:  [id number]  Date Initiated:  01/04/2014  Documentation initiated by:  Tomi Bamberger  Subjective/Objective Assessment:   dx abd pain,  admit- lives with spouse     Action/Plan:   Anticipated DC Date:  01/07/2014   Anticipated DC Plan:  [place]  CM consult      Choice offered to / List presented to:             Status of service:  Completed, signed off Medicare Important Message given?   (If response is "NO", the following Medicare IM given date fields will be blank) Date Medicare IM given:   Date Additional Medicare IM given:    Discharge Disposition:  HOME/SELF CARE  Per UR Regulation:  Reviewed for med. necessity/level of care/duration of stay  If discussed at [place] of Stay Meetings, dates discussed:    Comments:  01/07/14 15:22 Tomi Bamberger RN,BSN [phone] patient for dc today, no needs anticipated.  01/04/14 [place], BSN [phone] patient lives with spouse, lipase elevated, conts on ivf, npo.  NCM will continue to follow for dc needs.

## 2014-01-04 NOTE — Anesthesia Postprocedure Evaluation (Signed)
  Anesthesia Post-op Note  Patient: Adam Shaw  Procedure(s) Performed: Procedure(s) (LRB): ESOPHAGEAL ENDOSCOPIC ULTRASOUND (EUS) RADIAL (N/A)  Patient Location: PACU  Anesthesia Type: MAC  Level of Consciousness: awake and alert   Airway and Oxygen Therapy: Patient Spontanous Breathing  Post-op Pain: mild  Post-op Assessment: Post-op Vital signs reviewed, Patient's Cardiovascular Status Stable, Respiratory Function Stable, Patent Airway and No signs of Nausea or vomiting  Last Vitals:  Filed Vitals:   01/04/14 1500  BP: 120/57  Pulse:   Temp:   Resp: 19    Post-op Vital Signs: stable   Complications: No apparent anesthesia complications

## 2014-01-05 ENCOUNTER — Inpatient Hospital Stay (HOSPITAL_COMMUNITY): Payer: BC Managed Care – PPO

## 2014-01-05 ENCOUNTER — Inpatient Hospital Stay (HOSPITAL_COMMUNITY): Payer: BC Managed Care – PPO | Admitting: Anesthesiology

## 2014-01-05 ENCOUNTER — Encounter (HOSPITAL_COMMUNITY)
Admission: EM | Disposition: A | Discharge status: Home / Self Care | Payer: BC Managed Care – PPO | Source: Home / Self Care | Attending: Internal Medicine

## 2014-01-05 ENCOUNTER — Encounter (HOSPITAL_COMMUNITY): Payer: BC Managed Care – PPO | Admitting: Anesthesiology

## 2014-01-05 DIAGNOSIS — K810 Acute cholecystitis: Secondary | ICD-10-CM

## 2014-01-05 HISTORY — PX: CHOLECYSTECTOMY: SHX55

## 2014-01-05 LAB — COMPREHENSIVE METABOLIC PANEL
ALK PHOS: 104 U/L (ref 39–117)
ALT: 25 U/L (ref 0–53)
AST: 19 U/L (ref 0–37)
Albumin: 2.7 g/dL — ABNORMAL LOW (ref 3.5–5.2)
BUN: 11 mg/dL (ref 6–23)
CALCIUM: 8.2 mg/dL — AB (ref 8.4–10.5)
CO2: 18 mEq/L — ABNORMAL LOW (ref 19–32)
Chloride: 104 mEq/L (ref 96–112)
Creatinine, Ser: 1.04 mg/dL (ref 0.50–1.35)
GFR calc Af Amer: >90 mL/min (ref >90)
GFR, EST NON AFRICAN AMERICAN: 81 mL/min — AB (ref >90)
GLUCOSE: 71 mg/dL (ref 70–99)
POTASSIUM: 3.8 meq/L (ref 3.7–5.3)
SODIUM: 140 meq/L (ref 137–147)
Total Bilirubin: 0.6 mg/dL (ref 0.3–1.2)
Total Protein: 6.1 g/dL (ref 6.0–8.3)

## 2014-01-05 SURGERY — LAPAROSCOPIC CHOLECYSTECTOMY WITH INTRAOPERATIVE CHOLANGIOGRAM
Anesthesia: General | Site: Abdomen

## 2014-01-05 MED ORDER — LIDOCAINE HCL (CARDIAC) 20 MG/ML IV SOLN
INTRAVENOUS | Status: AC
  Filled 2014-01-05: qty 5

## 2014-01-05 MED ORDER — NEOSTIGMINE METHYLSULFATE 1 MG/ML IJ SOLN
INTRAMUSCULAR | Status: AC
  Filled 2014-01-05: qty 10

## 2014-01-05 MED ORDER — PHENYLEPHRINE 40 MCG/ML (10ML) SYRINGE FOR IV PUSH (FOR BLOOD PRESSURE SUPPORT)
PREFILLED_SYRINGE | INTRAVENOUS | Status: AC
  Filled 2014-01-05: qty 10

## 2014-01-05 MED ORDER — MIDAZOLAM HCL 2 MG/2ML IJ SOLN
INTRAMUSCULAR | Status: AC
  Filled 2014-01-05: qty 2

## 2014-01-05 MED ORDER — PHENYLEPHRINE HCL 10 MG/ML IJ SOLN
INTRAMUSCULAR | Status: DC | PRN
  Administered 2014-01-05: 80 ug via INTRAVENOUS
  Administered 2014-01-05: 120 ug via INTRAVENOUS
  Administered 2014-01-05: 80 ug via INTRAVENOUS

## 2014-01-05 MED ORDER — OXYCODONE HCL 5 MG/5ML PO SOLN: 5.0000 mg | Freq: Once | ORAL | Status: DC | PRN

## 2014-01-05 MED ORDER — ROCURONIUM BROMIDE 50 MG/5ML IV SOLN
INTRAVENOUS | Status: AC
  Filled 2014-01-05: qty 1

## 2014-01-05 MED ORDER — BUPIVACAINE-EPINEPHRINE 0.25% -1:200000 IJ SOLN
INTRAMUSCULAR | Status: DC | PRN
  Administered 2014-01-05: 20 mL

## 2014-01-05 MED ORDER — HYDROMORPHONE HCL PF 1 MG/ML IJ SOLN
0.2500 mg | INTRAMUSCULAR | Status: DC | PRN
  Administered 2014-01-05 (×2): 0.5 mg via INTRAVENOUS

## 2014-01-05 MED ORDER — MORPHINE SULFATE 10 MG/ML IJ SOLN
INTRAMUSCULAR | Status: AC
  Filled 2014-01-05: qty 1

## 2014-01-05 MED ORDER — MIDAZOLAM HCL 5 MG/5ML IJ SOLN
INTRAMUSCULAR | Status: DC | PRN
  Administered 2014-01-05: 2 mg via INTRAVENOUS

## 2014-01-05 MED ORDER — GLYCOPYRROLATE 0.2 MG/ML IJ SOLN
INTRAMUSCULAR | Status: AC
  Filled 2014-01-05: qty 2

## 2014-01-05 MED ORDER — BUPIVACAINE-EPINEPHRINE (PF) 0.25% -1:200000 IJ SOLN
INTRAMUSCULAR | Status: AC
  Filled 2014-01-05: qty 30

## 2014-01-05 MED ORDER — MORPHINE SULFATE 10 MG/ML IJ SOLN
INTRAMUSCULAR | Status: DC | PRN
  Administered 2014-01-05 (×2): 2 mg via INTRAVENOUS

## 2014-01-05 MED ORDER — ONDANSETRON HCL 4 MG/2ML IJ SOLN
INTRAMUSCULAR | Status: DC | PRN
  Administered 2014-01-05: 4 mg via INTRAVENOUS

## 2014-01-05 MED ORDER — PROMETHAZINE HCL 25 MG/ML IJ SOLN: 6.2500 mg | INTRAMUSCULAR | Status: DC | PRN

## 2014-01-05 MED ORDER — GLYCOPYRROLATE 0.2 MG/ML IJ SOLN
INTRAMUSCULAR | Status: DC | PRN
  Administered 2014-01-05: 0.6 mg via INTRAVENOUS

## 2014-01-05 MED ORDER — FENTANYL CITRATE 0.05 MG/ML IJ SOLN
INTRAMUSCULAR | Status: DC | PRN
  Administered 2014-01-05: 75 ug via INTRAVENOUS
  Administered 2014-01-05: 50 ug via INTRAVENOUS
  Administered 2014-01-05: 25 ug via INTRAVENOUS
  Administered 2014-01-05: 100 ug via INTRAVENOUS

## 2014-01-05 MED ORDER — SODIUM CHLORIDE 0.9 % IV SOLN
INTRAVENOUS | Status: DC | PRN
  Administered 2014-01-05: 11:00:00

## 2014-01-05 MED ORDER — HYDROMORPHONE HCL PF 1 MG/ML IJ SOLN
INTRAMUSCULAR | Status: AC
  Filled 2014-01-05: qty 1

## 2014-01-05 MED ORDER — ROCURONIUM BROMIDE 100 MG/10ML IV SOLN
INTRAVENOUS | Status: DC | PRN
  Administered 2014-01-05: 35 mg via INTRAVENOUS

## 2014-01-05 MED ORDER — SODIUM CHLORIDE 0.9 % IR SOLN
Status: DC | PRN
  Administered 2014-01-05: 1000 mL

## 2014-01-05 MED ORDER — NEOSTIGMINE METHYLSULFATE 1 MG/ML IJ SOLN
INTRAMUSCULAR | Status: DC | PRN
  Administered 2014-01-05: 4 mg via INTRAVENOUS

## 2014-01-05 MED ORDER — FENTANYL CITRATE 0.05 MG/ML IJ SOLN
INTRAMUSCULAR | Status: AC
  Filled 2014-01-05: qty 5

## 2014-01-05 MED ORDER — CEFAZOLIN SODIUM-DEXTROSE 2-3 GM-% IV SOLR
INTRAVENOUS | Status: AC
  Administered 2014-01-05: 2 g via INTRAVENOUS
  Filled 2014-01-05: qty 50

## 2014-01-05 MED ORDER — PROPOFOL 10 MG/ML IV BOLUS
INTRAVENOUS | Status: DC | PRN
  Administered 2014-01-05: 150 mg via INTRAVENOUS

## 2014-01-05 MED ORDER — LIDOCAINE HCL (CARDIAC) 20 MG/ML IV SOLN
INTRAVENOUS | Status: DC | PRN
  Administered 2014-01-05: 80 mg via INTRAVENOUS

## 2014-01-05 MED ORDER — LACTATED RINGERS IV SOLN
INTRAVENOUS | Status: DC | PRN
  Administered 2014-01-05 (×2): via INTRAVENOUS

## 2014-01-05 MED ORDER — OXYCODONE HCL 5 MG PO TABS: 5.0000 mg | ORAL_TABLET | Freq: Once | ORAL | Status: DC | PRN

## 2014-01-05 MED ORDER — ONDANSETRON HCL 4 MG/2ML IJ SOLN
INTRAMUSCULAR | Status: AC
  Filled 2014-01-05: qty 2

## 2014-01-05 MED ORDER — PROPOFOL 10 MG/ML IV BOLUS
INTRAVENOUS | Status: AC
  Filled 2014-01-05: qty 20

## 2014-01-05 SURGICAL SUPPLY — 34 items
APPLIER CLIP ROT 10 11.4 M/L (STAPLE) ×3
BLADE SURG ROTATE 9660 (MISCELLANEOUS) ×3 IMPLANT
CANISTER SUCTION 2500CC (MISCELLANEOUS) ×3 IMPLANT
CATH REDDICK CHOLANGI 4FR 50CM (CATHETERS) ×3 IMPLANT
CHLORAPREP W/TINT 26ML (MISCELLANEOUS) ×3 IMPLANT
CLIP APPLIE ROT 10 11.4 M/L (STAPLE) ×1 IMPLANT
COVER MAYO STAND STRL (DRAPES) ×3 IMPLANT
COVER SURGICAL LIGHT HANDLE (MISCELLANEOUS) ×3 IMPLANT
DECANTER SPIKE VIAL GLASS SM (MISCELLANEOUS) IMPLANT
DERMABOND ADVANCED (GAUZE/BANDAGES/DRESSINGS) ×2
DERMABOND ADVANCED .7 DNX12 (GAUZE/BANDAGES/DRESSINGS) ×1 IMPLANT
DRAPE C-ARM 42X72 X-RAY (DRAPES) ×3 IMPLANT
DRAPE UTILITY 15X26 W/TAPE STR (DRAPE) ×6 IMPLANT
ELECT REM PT RETURN 9FT ADLT (ELECTROSURGICAL) ×3
ELECTRODE REM PT RTRN 9FT ADLT (ELECTROSURGICAL) ×1 IMPLANT
GLOVE BIO SURGEON STRL SZ7.5 (GLOVE) ×3 IMPLANT
GOWN STRL NON-REIN LRG LVL3 (GOWN DISPOSABLE) ×12 IMPLANT
IV CATH 14GX2 1/4 (CATHETERS) ×3 IMPLANT
KIT BASIN OR (CUSTOM PROCEDURE TRAY) ×3 IMPLANT
KIT ROOM TURNOVER OR (KITS) ×3 IMPLANT
NS IRRIG 1000ML POUR BTL (IV SOLUTION) ×3 IMPLANT
PAD ARMBOARD 7.5X6 YLW CONV (MISCELLANEOUS) ×3 IMPLANT
POUCH SPECIMEN RETRIEVAL 10MM (ENDOMECHANICALS) ×3 IMPLANT
SCISSORS LAP 5X35 DISP (ENDOMECHANICALS) ×3 IMPLANT
SET IRRIG TUBING LAPAROSCOPIC (IRRIGATION / IRRIGATOR) ×3 IMPLANT
SLEEVE ENDOPATH XCEL 5M (ENDOMECHANICALS) ×3 IMPLANT
SPECIMEN JAR SMALL (MISCELLANEOUS) ×3 IMPLANT
SUT MNCRL AB 4-0 PS2 18 (SUTURE) ×3 IMPLANT
TOWEL OR 17X24 6PK STRL BLUE (TOWEL DISPOSABLE) ×3 IMPLANT
TOWEL OR 17X26 10 PK STRL BLUE (TOWEL DISPOSABLE) ×3 IMPLANT
TRAY LAPAROSCOPIC (CUSTOM PROCEDURE TRAY) ×3 IMPLANT
TROCAR XCEL BLUNT TIP 100MML (ENDOMECHANICALS) ×3 IMPLANT
TROCAR XCEL NON-BLD 11X100MML (ENDOMECHANICALS) ×3 IMPLANT
TROCAR XCEL NON-BLD 5MMX100MML (ENDOMECHANICALS) ×3 IMPLANT

## 2014-01-05 NOTE — Op Note (Signed)
01/03/2014 - 01/05/2014  11:22 AM  PATIENT:  Adam Shaw  53 y.o. male  PRE-OPERATIVE DIAGNOSIS:  Gallstone pancreatitis  POST-OPERATIVE DIAGNOSIS:  Gallstone pancreatitis  PROCEDURE:  Procedure(s): LAPAROSCOPIC CHOLECYSTECTOMY WITH INTRAOPERATIVE CHOLANGIOGRAM (N/A)  SURGEON:  Surgeon(s) and Role:    * Merrie Roof, MD - Primary  PHYSICIAN ASSISTANT:   ASSISTANTS: none   ANESTHESIA:   general  EBL:  Total I/O In: 1568.3 [I.V.:1568.3] Out: 100 [Urine:100]  BLOOD ADMINISTERED:none  DRAINS: none   LOCAL MEDICATIONS USED:  MARCAINE     SPECIMEN:  Source of Specimen:  gallbladder  DISPOSITION OF SPECIMEN:  PATHOLOGY  COUNTS:  YES  TOURNIQUET:  * No tourniquets in log *  DICTATION: .Dragon Dictation  Procedure: After informed consent was obtained the patient was brought to the operating room and placed in the supine position on the operating room table. After adequate induction of general anesthesia the patient's abdomen was prepped with ChloraPrep allowed to dry and draped in usual sterile manner. The area below the umbilicus was infiltrated with quarter percent  Marcaine. A small incision was made with a 15 blade knife. The incision was carried down through the subcutaneous tissue bluntly with a hemostat and [employer] retractors. The linea alba was identified. The linea alba was incised with a 15 blade knife and each side was grasped with Coker clamps. The preperitoneal space was then probed with a hemostat until the peritoneum was opened and access was gained to the abdominal cavity. A 0 Vicryl pursestring stitch was placed in the fascia surrounding the opening. A Hassan cannula was then placed through the opening and anchored in place with the previously placed Vicryl purse string stitch. The abdomen was insufflated with carbon dioxide without difficulty. A laparoscope was inserted through the [facility] cannula in the right upper quadrant was inspected. Next the epigastric  region was infiltrated with % Marcaine. A small incision was made with a 15 blade knife. A 10 mm port was placed bluntly through this incision into the abdominal cavity under direct vision. Next 2 sites were chosen laterally on the right side of the abdomen for placement of 5 mm ports. Each of these areas was infiltrated with quarter percent Marcaine. Small stab incisions were made with a 15 blade knife. 5 mm ports were then placed bluntly through these incisions into the abdominal cavity under direct vision without difficulty. A blunt grasper was placed through the lateralmost 5 mm port and used to grasp the dome of the gallbladder and elevated anteriorly and superiorly. Another blunt grasper was placed through the other 5 mm port and used to retract the body and neck of the gallbladder. A dissector was placed through the epigastric port and using the electrocautery the peritoneal reflection at the gallbladder neck was opened. Blunt dissection was then carried out in this area until the gallbladder neck-cystic duct junction was readily identified and a good window was created. A single clip was placed on the gallbladder neck. A small  ductotomy was made just below the clip with laparoscopic scissors. A 14-gauge Angiocath was then placed through the anterior abdominal wall under direct vision. A Reddick cholangiogram catheter was then placed through the Angiocath and flushed. The catheter was then placed in the cystic duct and anchored in place with a clip. A cholangiogram was obtained that showed no filling defects good emptying into the duodenum an adequate length on the cystic duct. The anchoring clip and catheters were then removed from the patient. 3 clips were  placed proximally on the cystic duct and the duct was divided between the 2 sets of clips. Posterior to this the cystic artery was identified and again dissected bluntly in a circumferential manner until a good window  was created. 2 clips were placed  proximally and one distally on the artery and the artery was divided between the 2 sets of clips. Next a laparoscopic hook cautery device was used to separate the gallbladder from the liver bed. Prior to completely detaching the gallbladder from the liver bed the liver bed was inspected and several small bleeding points were coagulated with the electrocautery until the area was completely hemostatic. The gallbladder was then detached the rest of it from the liver bed without difficulty. A laparoscopic bag was inserted through the epigastric port. The gallbladder was placed within the bag and the bag was sealed. A laparoscope was then moved to the epigastric port. The gallbladder grasper was placed through the [facility] cannula and used to grasp the opening of the bag. The bag with the gallbladder was then removed with the [facility] cannula through the infraumbilical port without difficulty. The fascial defect was then closed with the previously placed Vicryl pursestring stitch as well as with another figure-of-eight 0 Vicryl stitch. The liver bed was inspected again and found to be hemostatic. The abdomen was irrigated with copious amounts of saline until the effluent was clear. The ports were then removed under direct vision without difficulty and were found to be hemostatic. The gas was allowed to escape. The skin incisions were all closed with interrupted 4-0 Monocryl subcuticular stitches. Dermabond dressings were applied. The patient tolerated the procedure well. At the end of the case all needle sponge and instrument counts were correct. The patient was then awakened and taken to recovery in stable condition  PLAN OF CARE: Admit to inpatient   PATIENT DISPOSITION:  PACU - hemodynamically stable.   Delay start of Pharmacological VTE agent (>24hrs) due to surgical blood loss or risk of bleeding: yes

## 2014-01-05 NOTE — Preoperative (Signed)
Beta Blockers   Reason not to administer Beta Blockers:Not Applicable 

## 2014-01-05 NOTE — Progress Notes (Signed)
[facility] Progress Note  Subjective: Abdominal pain a little better, no apparent complications from EUS  Objective: Vital signs in last 24 hours: Temp:  [97.7 F (36.5 Shaw)-100.2 F (37.9 Shaw)] 98.7 F (37.1 Shaw) (02/07 0623) Pulse Rate:  [65-108] 78 (02/07 0623) Resp:  [13-23] 20 (02/07 0623) BP: (106-141)/(48-99) 106/61 mmHg (02/07 0623) SpO2:  [94 %-100 %] 97 % (02/07 0623) Weight:  [60.782 kg (134 lb)] 60.782 kg (134 lb) (02/07 0623) Weight change: 2.767 kg (6 lb 1.6 oz)   PE: Abdomen soft mildly tender  Lab Results: Results for orders placed during the hospital encounter of 01/03/14 (from the past 24 hour(s))  COMPREHENSIVE METABOLIC PANEL     Status: Abnormal   Collection Time    01/05/14  5:24 AM      Result Value Range   Sodium 140  137 - 147 mEq/L   Potassium 3.8  3.7 - 5.3 mEq/L   Chloride 104  96 - 112 mEq/L   CO2 18 (*) 19 - 32 mEq/L   Glucose, Bld 71  70 - 99 mg/dL   BUN 11  6 - 23 mg/dL   Creatinine, Ser 1.04  0.50 - 1.35 mg/dL   Calcium 8.2 (*) 8.4 - 10.5 mg/dL   Total Protein 6.1  6.0 - 8.3 g/dL   Albumin 2.7 (*) 3.5 - 5.2 g/dL   AST 19  0 - 37 U/L   ALT 25  0 - 53 U/L   Alkaline Phosphatase 104  39 - 117 U/L   Total Bilirubin 0.6  0.3 - 1.2 mg/dL   GFR calc non Af Amer 81 (*) >90 mL/min   GFR calc Af Amer >90  >90 mL/min    Studies/Results: US Abdomen Limited Ruq  01/03/2014   CLINICAL DATA:  53 year old male with recurrent pancreatitis. Epigastric pain. Initial encounter. Biliary ductal dilatation on recent exams.  EXAM: US ABDOMEN LIMITED - RIGHT UPPER QUADRANT  COMPARISON:  MRCP 12/31/2013.  Ultrasound of the abdomen 12/31/2013.  FINDINGS: Gallbladder:  Wall thickening up to 3 mm, not significantly changed. Evidence of adenomyomatosis, but was more apparent on the 12/31/2013 [place]. No pericholecystic fluid. No sonographic Murphy sign elicited.  Common bile duct:  Diameter: Up to 8 mm diameter today, previously 8-9 mm by ultrasound and MRCP.  Suggestion of mild wall thickening of the CBD, unchanged.  Liver:  Stable to decreased mild intrahepatic ductal dilatation. No discrete liver lesion. No perihepatic fluid.  Pancreas: No pancreatic ductal enlargement. Parenchymal echotexture within normal limits.  IMPRESSION: 1. Essentially stable since the MRCP on 12/31/2013; intra and extrahepatic biliary ductal enlargement is stable to mildly decreased. 2. Gallbladder adenomyomatosis. 3. Negative sonographic appearance of the pancreas.   Electronically Signed   By: Lars Pinks M.D.   On: 01/03/2014 20:23      Assessment: Recurrent pancreatitis suspected biliary despite no obvious common bile duct stones  Plan: Surgery is seen the patient is set up for cholecystectomy with intraoperative cholangiogram today. We'll sign off if intraoperative cholangiogram negative    Adam Shaw 01/05/2014, 9:30 AM

## 2014-01-05 NOTE — Anesthesia Procedure Notes (Signed)
Procedure Name: Intubation Date/Time: 01/05/2014 10:15 AM Performed by: Trixie Deis A Pre-anesthesia Checklist: Patient identified, Timeout performed, Emergency Drugs available, Suction available and Patient being monitored Patient Re-evaluated:Patient Re-evaluated prior to inductionOxygen Delivery Method: Circle system utilized Preoxygenation: Pre-oxygenation with 100% oxygen Intubation Type: IV induction Ventilation: Mask ventilation without difficulty Laryngoscope Size: Mac and 4 Grade View: Grade I Tube type: Oral Tube size: 8.0 mm Number of attempts: 1 Airway Equipment and Method: Stylet Placement Confirmation: ETT inserted through vocal cords under direct vision,  breath sounds checked- equal and bilateral and positive ETCO2 Secured at: 23 cm Tube secured with: Tape Dental Injury: Teeth and Oropharynx as per pre-operative assessment

## 2014-01-05 NOTE — Transfer of Care (Signed)
Immediate Anesthesia Transfer of Care Note  Patient: Adam Shaw  Procedure(s) Performed: Procedure(s): LAPAROSCOPIC CHOLECYSTECTOMY WITH INTRAOPERATIVE CHOLANGIOGRAM (N/A)  Patient Location: PACU  Anesthesia Type:General  Level of Consciousness: awake, alert  and oriented  Airway & Oxygen Therapy: Patient Spontanous Breathing and Patient connected to nasal cannula oxygen  Post-op Assessment: Report given to PACU RN, Post -op Vital signs reviewed and stable and Patient moving all extremities  Post vital signs: Reviewed and stable  Complications: No apparent anesthesia complications

## 2014-01-05 NOTE — Progress Notes (Signed)
[facility] PROGRESS NOTE   Adam Shaw GHW:[id number] DOB: 06/27/1961 DOA: 01/03/2014 PCP: No PCP Per Patient  HPI/Subjective: Endoscopic ultrasound yesterday showed sludge suggesting this is biliary pancreatitis. General surgery consulted, patient undergone laparoscopic cholecystectomy this morning.  Assessment/Plan: Active Problems:   Pancreatitis   Elevated LFTs   Acute pancreatitis -Recurrent acute pancreatitis, patient was recently discharged from the hospital. -Recent extensive workup with MRCP, ultrasound, autoimmune workup and a lipid profile showed normal findings. -Endoscopic ultrasound showed sludge, general surgery consulted and a laparoscopic cholecystectomy done on 2/7. -Advance diet as tolerated.  Elevated LFTs -Elevated AST/ALT and alkaline phosphatase, resolved overnight. - I notified GI, if they wants to consider endoscopic [place] during this hospital stay.  Code Status: Full code Family Communication: Plan discussed with the patient. Disposition Plan: Remains inpatient   Consultants:  None  Procedures:  None  Antibiotics:  None   Objective: Filed Vitals:   01/05/14 1225  BP:   Pulse: 81  Temp: 98 F (36.7 C)  Resp: 18    Intake/Output Summary (Last 24 hours) at 01/05/14 1242 Last data filed at 01/05/14 1200  Gross per 24 hour  Intake 3343.33 ml  Output   1700 ml  Net 1643.33 ml   Filed Weights   01/03/14 2105 01/05/14 0623  Weight: 58.015 kg (127 lb 14.4 oz) 60.782 kg (134 lb)    Exam: General: Alert and awake, oriented x3, not in any acute distress. HEENT: anicteric sclera, pupils reactive to light and accommodation, EOMI CVS: S1-S2 clear, no murmur rubs or gallops Chest: clear to auscultation bilaterally, no wheezing, rales or rhonchi Abdomen: soft nontender, nondistended, normal bowel sounds, no organomegaly Extremities: no cyanosis, clubbing or edema noted bilaterally Neuro: Cranial nerves II-XII intact, no focal  neurological deficits  Data Reviewed: Basic Metabolic Panel:  Recent Labs Lab 12/31/13 0103 01/01/14 0531 01/03/14 1800 01/04/14 0553 01/05/14 0524  NA 140 142 140 142 140  K 3.9 4.2 3.7 3.9 3.8  CL 102 108 99 108 104  CO2 26 24 28 23  18*  GLUCOSE 177* 91 162* 91 71  BUN 19 8 12 8 11   CREATININE 0.95 0.95 0.96 0.92 1.04  CALCIUM 8.8 8.3* 9.4 8.0* 8.2*   Liver Function Tests:  Recent Labs Lab 12/31/13 0103 01/01/14 0531 01/03/14 1800 01/04/14 0553 01/05/14 0524  AST 49* 18 89* 29 19  ALT 29 20 57* 34 25  ALKPHOS 99 92 149* 106 104  BILITOT 0.3 0.5 0.5 0.5 0.6  PROT 7.1 6.1 8.1 6.0 6.1  ALBUMIN 3.3* 2.8* 3.8 2.7* 2.7*    Recent Labs Lab 12/31/13 0103 01/01/14 0531 01/03/14 1800 01/04/14 0553  LIPASE 718* 24 1828* 236*   No results found for this basename: AMMONIA,  in the last 168 hours CBC:  Recent Labs Lab 12/31/13 0103 01/01/14 0531 01/03/14 1800 01/04/14 0553  WBC 8.5 7.1 11.6* 7.6  NEUTROABS 5.4  --  9.9*  --   HGB 13.6 13.2 14.9 12.4*  HCT 41.1 39.5 43.2 36.7*  MCV 82.2 82.6 81.8 81.6  PLT 167 174 206 162   Cardiac Enzymes: No results found for this basename: CKTOTAL, CKMB, CKMBINDEX, TROPONINI,  in the last 168 hours BNP (last 3 results) No results found for this basename: PROBNP,  in the last 8760 hours CBG: No results found for this basename: GLUCAP,  in the last 168 hours  Micro No results found for this or any previous visit (from the past 240 hour(s)).   Studies: Dg Cholangiogram  Operative  01/05/2014   CLINICAL DATA:  Intra op evaluation  EXAM: INTRAOPERATIVE CHOLANGIOGRAM  TECHNIQUE: Cholangiographic images from the C-arm fluoroscopic device were submitted for interpretation post-operatively. Please see the procedural report for the amount of contrast and the fluoroscopy time utilized.  COMPARISON:  None.  FINDINGS: Intraoperative spot images show normal caliber biliary system. No evidence of retained stone or obstruction. Free  passage of contrast into the small bowel noted. There is evidence of mild reflux of contrast into the distal pancreatic duct.  IMPRESSION: Unremarkable intraoperative cholangiogram.   Electronically Signed   By: Margaree Mackintosh M.D.   On: 01/05/2014 11:02   US Abdomen Limited Ruq  01/03/2014   CLINICAL DATA:  53 year old male with recurrent pancreatitis. Epigastric pain. Initial encounter. Biliary ductal dilatation on recent exams.  EXAM: US ABDOMEN LIMITED - RIGHT UPPER QUADRANT  COMPARISON:  MRCP 12/31/2013.  Ultrasound of the abdomen 12/31/2013.  FINDINGS: Gallbladder:  Wall thickening up to 3 mm, not significantly changed. Evidence of adenomyomatosis, but was more apparent on the 12/31/2013 [place]. No pericholecystic fluid. No sonographic Murphy sign elicited.  Common bile duct:  Diameter: Up to 8 mm diameter today, previously 8-9 mm by ultrasound and MRCP. Suggestion of mild wall thickening of the CBD, unchanged.  Liver:  Stable to decreased mild intrahepatic ductal dilatation. No discrete liver lesion. No perihepatic fluid.  Pancreas: No pancreatic ductal enlargement. Parenchymal echotexture within normal limits.  IMPRESSION: 1. Essentially stable since the MRCP on 12/31/2013; intra and extrahepatic biliary ductal enlargement is stable to mildly decreased. 2. Gallbladder adenomyomatosis. 3. Negative sonographic appearance of the pancreas.   Electronically Signed   By: Lars Pinks M.D.   On: 01/03/2014 20:23    Scheduled Meds: . heparin  5,000 Units Subcutaneous Q8H  . pantoprazole  40 mg Oral Daily  . polyethylene glycol  17 g Oral Daily   Continuous Infusions: . sodium chloride 100 mL/hr at 01/05/14 1237       Time spent: 35 minutes    [facility] A  [facility] Pager [phone] If 7PM-7AM, please contact night-coverage at [email or web address], password [facility] 01/05/2014, 12:42 PM  LOS: 2 days

## 2014-01-05 NOTE — Progress Notes (Signed)
1 Day Post-Op  Subjective: Feeling a little better. lft's have normalized  Objective: Vital signs in last 24 hours: Temp:  [97.7 F (36.5 C)-100.2 F (37.9 C)] 98.7 F (37.1 C) (02/07 0623) Pulse Rate:  [65-108] 78 (02/07 0623) Resp:  [13-23] 20 (02/07 0623) BP: (106-141)/(48-99) 106/61 mmHg (02/07 0623) SpO2:  [94 %-100 %] 97 % (02/07 0623) Weight:  [134 lb (60.782 kg)] 134 lb (60.782 kg) (02/07 6237) Last BM Date: 01/01/14  Intake/Output from previous day: 02/06 0701 - 02/07 0700 In: 2342.5 [I.V.:2342.5] Out: 1450 [Urine:1450] Intake/Output this shift: Total I/O In: 168.3 [I.V.:168.3] Out: -   Resp: clear to auscultation bilaterally Cardio: regular rate and rhythm GI: soft, mild central tenderness. no guarding   Lab Results:   Recent Labs  01/03/14 1800 01/04/14 0553  WBC 11.6* 7.6  HGB 14.9 12.4*  HCT 43.2 36.7*  PLT 206 162   BMET  Recent Labs  01/04/14 0553 01/05/14 0524  NA 142 140  K 3.9 3.8  CL 108 104  CO2 23 18*  GLUCOSE 91 71  BUN 8 11  CREATININE 0.92 1.04  CALCIUM 8.0* 8.2*   PT/INR No results found for this basename: LABPROT, INR,  in the last 72 hours ABG No results found for this basename: PHART, PCO2, PO2, HCO3,  in the last 72 hours  Studies/Results: US Abdomen Limited Ruq  01/03/2014   CLINICAL DATA:  53 year old male with recurrent pancreatitis. Epigastric pain. Initial encounter. Biliary ductal dilatation on recent exams.  EXAM: US ABDOMEN LIMITED - RIGHT UPPER QUADRANT  COMPARISON:  MRCP 12/31/2013.  Ultrasound of the abdomen 12/31/2013.  FINDINGS: Gallbladder:  Wall thickening up to 3 mm, not significantly changed. Evidence of adenomyomatosis, but was more apparent on the 12/31/2013 [place]. No pericholecystic fluid. No sonographic Murphy sign elicited.  Common bile duct:  Diameter: Up to 8 mm diameter today, previously 8-9 mm by ultrasound and MRCP. Suggestion of mild wall thickening of the CBD, unchanged.  Liver:  Stable to decreased  mild intrahepatic ductal dilatation. No discrete liver lesion. No perihepatic fluid.  Pancreas: No pancreatic ductal enlargement. Parenchymal echotexture within normal limits.  IMPRESSION: 1. Essentially stable since the MRCP on 12/31/2013; intra and extrahepatic biliary ductal enlargement is stable to mildly decreased. 2. Gallbladder adenomyomatosis. 3. Negative sonographic appearance of the pancreas.   Electronically Signed   By: Lars Pinks M.D.   On: 01/03/2014 20:23    Anti-infectives: Anti-infectives   None      Assessment/Plan: s/p Procedure(s): ESOPHAGEAL ENDOSCOPIC ULTRASOUND (EUS) RADIAL (N/A) Now that he is recovering from gallstone pancreatits, I think he would benefit from having his gallbladder removed. I have discussed with him the risks and benefits of surgery as well as some of the technical aspects and he understands and wishes to proceed. Will plan for lap chole with ioc today  LOS: 2 days    TOTH III,Gordie Crumby S 01/05/2014

## 2014-01-05 NOTE — Anesthesia Postprocedure Evaluation (Signed)
Anesthesia Post Note  Patient: Adam Shaw  Procedure(s) Performed: Procedure(s) (LRB): LAPAROSCOPIC CHOLECYSTECTOMY WITH INTRAOPERATIVE CHOLANGIOGRAM (N/A)  Anesthesia type: general  Patient location: PACU  Post pain: Pain level controlled  Post assessment: Patient's Cardiovascular Status Stable  Last Vitals:  Filed Vitals:   01/05/14 1215  BP:   Pulse: 83  Temp:   Resp: 15    Post vital signs: Reviewed and stable  Level of consciousness: sedated  Complications: No apparent anesthesia complications

## 2014-01-05 NOTE — Anesthesia Preprocedure Evaluation (Addendum)
Anesthesia Evaluation  Patient identified by MRN, date of birth, ID band Patient awake    Reviewed: Allergy & Precautions, H&P , NPO status , Patient's Chart, lab work & pertinent test results  Airway Mallampati: I TM Distance: >3 FB Neck ROM: Full    Dental  (+) Poor Dentition and Dental Advisory Given   Pulmonary neg pulmonary ROS,    Pulmonary exam normal       Cardiovascular negative cardio ROS      Neuro/Psych negative neurological ROS  negative psych ROS   GI/Hepatic Neg liver ROS, Medicated,  Endo/Other  negative endocrine ROS  Renal/GU negative Renal ROS     Musculoskeletal   Abdominal   Peds  Hematology   Anesthesia Other Findings   Reproductive/Obstetrics negative OB ROS                          Anesthesia Physical Anesthesia Plan  ASA: II  Anesthesia Plan: General ETT   Post-op Pain Management:    Induction: Intravenous  Airway Management Planned: Oral ETT  Additional Equipment:   Intra-op Plan:   Post-operative Plan: Extubation in OR  Informed Consent: I have reviewed the patients History and Physical, chart, labs and discussed the procedure including the risks, benefits and alternatives for the proposed anesthesia with the patient or authorized representative who has indicated his/her understanding and acceptance.   Dental advisory given  Plan Discussed with: Anesthesiologist, CRNA and Surgeon  Anesthesia Plan Comments:        Anesthesia Quick Evaluation

## 2014-01-06 DIAGNOSIS — R509 Fever, unspecified: Secondary | ICD-10-CM | POA: Diagnosis present

## 2014-01-06 LAB — CBC
HCT: 37.9 % — ABNORMAL LOW (ref 39.0–52.0)
HEMOGLOBIN: 12.7 g/dL — AB (ref 13.0–17.0)
MCH: 27.4 pg (ref 26.0–34.0)
MCHC: 33.5 g/dL (ref 30.0–36.0)
MCV: 81.9 fL (ref 78.0–100.0)
Platelets: 170 10*3/uL (ref 150–400)
RBC: 4.63 MIL/uL (ref 4.22–5.81)
RDW: 13.6 % (ref 11.5–15.5)
WBC: 11 10*3/uL — ABNORMAL HIGH (ref 4.0–10.5)

## 2014-01-06 LAB — BASIC METABOLIC PANEL
BUN: 4 mg/dL — ABNORMAL LOW (ref 6–23)
CALCIUM: 8 mg/dL — AB (ref 8.4–10.5)
CO2: 23 mEq/L (ref 19–32)
Chloride: 104 mEq/L (ref 96–112)
Creatinine, Ser: 0.98 mg/dL (ref 0.50–1.35)
GFR calc Af Amer: >90 mL/min (ref >90)
GFR calc non Af Amer: >90 mL/min (ref >90)
GLUCOSE: 108 mg/dL — AB (ref 70–99)
POTASSIUM: 3.9 meq/L (ref 3.7–5.3)
Sodium: 139 mEq/L (ref 137–147)

## 2014-01-06 MED ORDER — MAGNESIUM HYDROXIDE 400 MG/5ML PO SUSP
30.0000 mL | Freq: Once | ORAL | Status: AC
  Administered 2014-01-06: 30 mL via ORAL
  Filled 2014-01-06: qty 30

## 2014-01-06 MED ORDER — OXYCODONE HCL 5 MG PO TABS
5.0000 mg | ORAL_TABLET | ORAL | Status: DC | PRN
  Administered 2014-01-06: 5 mg via ORAL
  Filled 2014-01-06: qty 1

## 2014-01-06 NOTE — Progress Notes (Signed)
Advance diet. Probably ready for discharge later today.  Imogene Burn. Georgette Dover, MD, [facility]  General/ Trauma Surgery  01/06/2014 10:15 AM

## 2014-01-06 NOTE — Progress Notes (Signed)
Patient ID: Adam Shaw, male   DOB: 12-03-1960, 53 y.o.   MRN: [id number] 1 Day Post-Op  Subjective: Pt feels ok today.  Some pain, but controlled with oral pain meds.  Tolerating clear liquids with no nausea  Objective: Vital signs in last 24 hours: Temp:  [98 F (36.7 C)-99.4 F (37.4 C)] 99.4 F (37.4 C) (02/08 0547) Pulse Rate:  [61-96] 93 (02/08 0547) Resp:  [15-20] 16 (02/08 0547) BP: (102-123)/(45-72) 123/72 mmHg (02/08 0547) SpO2:  [93 %-99 %] 94 % (02/08 0547) Last BM Date: 01/04/14  Intake/Output from previous day: 02/07 0701 - 02/08 0700 In: 4178.3 [P.O.:1600; I.V.:2578.3] Out: 1470 [Urine:1470] Intake/Output this shift: Total I/O In: 1365 [I.V.:1365] Out: -   PE: Abd: soft, appropriately tender, +BS, ND, incisions c/d/i with dermabond  Lab Results:   Recent Labs  01/04/14 0553 01/06/14 0520  WBC 7.6 11.0*  HGB 12.4* 12.7*  HCT 36.7* 37.9*  PLT 162 170   BMET  Recent Labs  01/05/14 0524 01/06/14 0520  NA 140 139  K 3.8 3.9  CL 104 104  CO2 18* 23  GLUCOSE 71 108*  BUN 11 4*  CREATININE 1.04 0.98  CALCIUM 8.2* 8.0*   PT/INR No results found for this basename: LABPROT, INR,  in the last 72 hours CMP     Component Value Date/Time   NA 139 01/06/2014 0520   K 3.9 01/06/2014 0520   CL 104 01/06/2014 0520   CO2 23 01/06/2014 0520   GLUCOSE 108* 01/06/2014 0520   BUN 4* 01/06/2014 0520   CREATININE 0.98 01/06/2014 0520   CALCIUM 8.0* 01/06/2014 0520   PROT 6.1 01/05/2014 0524   ALBUMIN 2.7* 01/05/2014 0524   AST 19 01/05/2014 0524   ALT 25 01/05/2014 0524   ALKPHOS 104 01/05/2014 0524   BILITOT 0.6 01/05/2014 0524   GFRNONAA >90 01/06/2014 0520   GFRAA >90 01/06/2014 0520   Lipase     Component Value Date/Time   LIPASE 236* 01/04/2014 0553       Studies/Results: Dg Cholangiogram Operative  01/05/2014   CLINICAL DATA:  Intra op evaluation  EXAM: INTRAOPERATIVE CHOLANGIOGRAM  TECHNIQUE: Cholangiographic images from the C-arm fluoroscopic device were submitted  for interpretation post-operatively. Please see the procedural report for the amount of contrast and the fluoroscopy time utilized.  COMPARISON:  None.  FINDINGS: Intraoperative spot images show normal caliber biliary system. No evidence of retained stone or obstruction. Free passage of contrast into the small bowel noted. There is evidence of mild reflux of contrast into the distal pancreatic duct.  IMPRESSION: Unremarkable intraoperative cholangiogram.   Electronically Signed   By: Margaree Mackintosh M.D.   On: 01/05/2014 11:02    Anti-infectives: Anti-infectives   Start     Dose/Rate Route Frequency Ordered Stop   01/05/14 0948  ceFAZolin (ANCEF) 2-3 GM-% IVPB SOLR    Comments:  Trixie Deis   : cabinet override      01/05/14 0948 01/05/14 1015       Assessment/Plan  1. POD 1, s/p lap chole for gs pancreatitis  Plan: 1. Advance to regular diet.  Pain is controlled with oral pain medication.  If he tolerates his solid diet, he is stable for dc home today from a surgical standpoint.  I have written him a work note and arranged for follow up.   LOS: 3 days    Mayia Megill E 01/06/2014, 9:09 AM Pager: [phone]

## 2014-01-06 NOTE — Discharge Instructions (Signed)
[facility] ______[facility] °LAPAROSCOPIC SURGERY: POST OP INSTRUCTIONS °Always review your discharge instruction sheet given to you by the facility where your surgery was performed. °IF YOU HAVE DISABILITY OR FAMILY LEAVE FORMS, YOU MUST BRING THEM TO THE OFFICE FOR PROCESSING.   °DO NOT GIVE THEM TO YOUR DOCTOR. ° °1. A prescription for pain medication may be given to you upon discharge.  Take your pain medication as prescribed, if needed.  If narcotic pain medicine is not needed, then you may take acetaminophen (Tylenol) or ibuprofen (Advil) as needed. °2. Take your usually prescribed medications unless otherwise directed. °3. If you need a refill on your pain medication, please contact your pharmacy.  They will contact our office to request authorization. Prescriptions will not be filled after 5pm or on week-ends. °4. You should follow a light diet the first few days after arrival home, such as soup and crackers, etc.  Be sure to include lots of fluids daily. °5. Most patients will experience some swelling and bruising in the area of the incisions.  Ice packs will help.  Swelling and bruising can take several days to resolve.  °6. It is common to experience some constipation if taking pain medication after surgery.  Increasing fluid intake and taking a stool softener (such as Colace) will usually help or prevent this problem from occurring.  A mild laxative (Milk of Magnesia or Miralax) should be taken according to package instructions if there are no bowel movements after 48 hours. °7. Unless discharge instructions indicate otherwise, you may remove your bandages 24-48 hours after surgery, and you may shower at that time.  You may have steri-strips (small skin tapes) in place directly over the incision.  These strips should be left on the skin for 7-10 days.  If your surgeon used skin glue on the incision, you may shower in 24 hours.  The glue will flake off over the next 2-3 weeks.  Any sutures or  staples will be removed at the office during your follow-up visit. °8. ACTIVITIES:  You may resume regular (light) daily activities beginning the next day--such as daily self-care, walking, climbing stairs--gradually increasing activities as tolerated.  You may have sexual intercourse when it is comfortable.  Refrain from any heavy lifting or straining until approved by your doctor. °a. You may drive when you are no longer taking prescription pain medication, you can comfortably wear a seatbelt, and you can safely maneuver your car and apply brakes. °b. RETURN TO WORK:  __________________________________________________________ °9. You should see your doctor in the office for a follow-up appointment approximately 2-3 weeks after your surgery.  Make sure that you call for this appointment within a day or two after you arrive home to insure a convenient appointment time. °10. OTHER INSTRUCTIONS: __________________________________________________________________________________________________________________________ __________________________________________________________________________________________________________________________ °WHEN TO CALL YOUR DOCTOR: °1. Fever over 101.0 °2. Inability to urinate °3. Continued bleeding from incision. °4. Increased pain, redness, or drainage from the incision. °5. Increasing abdominal pain ° °The clinic staff is available to answer your questions during regular business hours.  Please don’t hesitate to call and ask to speak to one of the nurses for clinical concerns.  If you have a medical emergency, go to the nearest emergency room or call 911.  A surgeon from [facility] is always on call at the hospital. °[address], [address], [place], [place]  [address] ? [address] [address], [place], [place]   [address] °[phone] ? [phone] ? FAX [phone] °Web site:   [email or web address] °

## 2014-01-06 NOTE — Progress Notes (Signed)
[facility] PROGRESS NOTE   Adam Shaw NFA:[id number] DOB: 27-Feb-1961 DOA: 01/03/2014 PCP: No PCP Per Patient  HPI/Subjective: Feels much better with some soreness in his abdomen. Had low-grade fever of 99.4, likely post surgery Likely to be discharged later today or tomorrow morning.  Assessment/Plan: Active Problems:   Pancreatitis   Elevated LFTs   Acute pancreatitis -Recurrent acute pancreatitis, patient was recently discharged from the hospital. -Recent extensive workup with MRCP, ultrasound, autoimmune workup and a lipid profile showed normal findings. -Endoscopic ultrasound showed sludge, general surgery consulted and a laparoscopic cholecystectomy done on 2/7. -Advance diet as tolerated.  Elevated LFTs -Elevated AST/ALT and alkaline phosphatase, resolved overnight. - I notified GI, if they wants to consider endoscopic [place] during this hospital stay.  Low-grade fever -Had a fever of 100.4 on 2/6, last night low-grade fever of 99.4. -Patient denies any cough, denies any urinary symptoms. -Could be secondary to transient viral infection, RUQ is nontender and very unlikely to be biliary tree infection  Code Status: Full code Family Communication: Plan discussed with the patient. Disposition Plan: Remains inpatient   Consultants:  None  Procedures:  None  Antibiotics:  None   Objective: Filed Vitals:   01/06/14 0547  BP: 123/72  Pulse: 93  Temp: 99.4 F (37.4 C)  Resp: 16    Intake/Output Summary (Last 24 hours) at 01/06/14 1040 Last data filed at 01/06/14 4696  Gross per 24 hour  Intake   5375 ml  Output   1770 ml  Net   3605 ml   Filed Weights   01/03/14 2105 01/05/14 0623  Weight: 58.015 kg (127 lb 14.4 oz) 60.782 kg (134 lb)    Exam: General: Alert and awake, oriented x3, not in any acute distress. HEENT: anicteric sclera, pupils reactive to light and accommodation, EOMI CVS: S1-S2 clear, no murmur rubs or gallops Chest: clear  to auscultation bilaterally, no wheezing, rales or rhonchi Abdomen: soft nontender, nondistended, normal bowel sounds, no organomegaly Extremities: no cyanosis, clubbing or edema noted bilaterally Neuro: Cranial nerves II-XII intact, no focal neurological deficits  Data Reviewed: Basic Metabolic Panel:  Recent Labs Lab 01/01/14 0531 01/03/14 1800 01/04/14 0553 01/05/14 0524 01/06/14 0520  NA 142 140 142 140 139  K 4.2 3.7 3.9 3.8 3.9  CL 108 99 108 104 104  CO2 24 28 23  18* 23  GLUCOSE 91 162* 91 71 108*  BUN 8 12 8 11  4*  CREATININE 0.95 0.96 0.92 1.04 0.98  CALCIUM 8.3* 9.4 8.0* 8.2* 8.0*   Liver Function Tests:  Recent Labs Lab 12/31/13 0103 01/01/14 0531 01/03/14 1800 01/04/14 0553 01/05/14 0524  AST 49* 18 89* 29 19  ALT 29 20 57* 34 25  ALKPHOS 99 92 149* 106 104  BILITOT 0.3 0.5 0.5 0.5 0.6  PROT 7.1 6.1 8.1 6.0 6.1  ALBUMIN 3.3* 2.8* 3.8 2.7* 2.7*    Recent Labs Lab 12/31/13 0103 01/01/14 0531 01/03/14 1800 01/04/14 0553  LIPASE 718* 24 1828* 236*   No results found for this basename: AMMONIA,  in the last 168 hours CBC:  Recent Labs Lab 12/31/13 0103 01/01/14 0531 01/03/14 1800 01/04/14 0553 01/06/14 0520  WBC 8.5 7.1 11.6* 7.6 11.0*  NEUTROABS 5.4  --  9.9*  --   --   HGB 13.6 13.2 14.9 12.4* 12.7*  HCT 41.1 39.5 43.2 36.7* 37.9*  MCV 82.2 82.6 81.8 81.6 81.9  PLT 167 174 206 162 170   Cardiac Enzymes: No results found for this  basename: CKTOTAL, CKMB, CKMBINDEX, TROPONINI,  in the last 168 hours BNP (last 3 results) No results found for this basename: PROBNP,  in the last 8760 hours CBG: No results found for this basename: GLUCAP,  in the last 168 hours  Micro No results found for this or any previous visit (from the past 240 hour(s)).   Studies: Dg Cholangiogram Operative  01/05/2014   CLINICAL DATA:  Intra op evaluation  EXAM: INTRAOPERATIVE CHOLANGIOGRAM  TECHNIQUE: Cholangiographic images from the C-arm fluoroscopic device  were submitted for interpretation post-operatively. Please see the procedural report for the amount of contrast and the fluoroscopy time utilized.  COMPARISON:  None.  FINDINGS: Intraoperative spot images show normal caliber biliary system. No evidence of retained stone or obstruction. Free passage of contrast into the small bowel noted. There is evidence of mild reflux of contrast into the distal pancreatic duct.  IMPRESSION: Unremarkable intraoperative cholangiogram.   Electronically Signed   By: Margaree Mackintosh M.D.   On: 01/05/2014 11:02    Scheduled Meds: . heparin  5,000 Units Subcutaneous Q8H  . pantoprazole  40 mg Oral Daily  . polyethylene glycol  17 g Oral Daily   Continuous Infusions: . sodium chloride 100 mL/hr at 01/06/14 1028       Time spent: 35 minutes    [facility]  [facility] Pager [phone] If 7PM-7AM, please contact night-coverage at [email or web address], password [facility] 01/06/2014, 10:40 AM  LOS: 3 days

## 2014-01-07 ENCOUNTER — Encounter (HOSPITAL_COMMUNITY): Payer: Self-pay | Admitting: Gastroenterology

## 2014-01-07 DIAGNOSIS — R509 Fever, unspecified: Secondary | ICD-10-CM

## 2014-01-07 NOTE — Progress Notes (Signed)
Agree Lional Icenogle, MD, MPH, FACS Pager: [phone]  

## 2014-01-07 NOTE — Progress Notes (Signed)
[name] to be D/C'd Home per MD order.  Discussed with the patient and all questions fully answered.    Medication List         multivitamin with minerals Tabs tablet  Take 1 tablet by mouth daily.     omeprazole 40 MG capsule  Commonly known as:  PRILOSEC  Take 1 capsule (40 mg total) by mouth daily.     oxyCODONE 5 MG immediate release tablet  Commonly known as:  Oxy IR/ROXICODONE  Take 1 tablet (5 mg total) by mouth every 4 (four) hours as needed for severe pain.        VVS, Skin clean, dry and intact without evidence of skin break down, no evidence of skin tears noted. IV catheter discontinued intact. Site without signs and symptoms of complications. Dressing and pressure applied.  An After Visit Summary was printed and given to the patient.  D/c education completed with patient/family including follow up instructions, medication list, d/c activities limitations if indicated, with other d/c instructions as indicated by MD - patient able to verbalize understanding, all questions fully answered.   Patient instructed to return to ED, call 911, or call MD for any changes in condition.   Patient escorted via [place], and D/C home via private auto.  Audria Nine F 01/07/2014 2:40 PM

## 2014-01-07 NOTE — Progress Notes (Signed)
Patient ID: Adam Shaw, male   DOB: 12-13-1960, 52 y.o.   MRN: [id number] 2 Days Post-Op  Subjective: Pt feels well today.  Tolerating a solid diet.  Pain well controlled  Objective: Vital signs in last 24 hours: Temp:  [98.7 F (37.1 C)-99.9 F (37.7 C)] 98.7 F (37.1 C) (02/09 0650) Pulse Rate:  [69-93] 69 (02/09 0650) Resp:  [16] 16 (02/09 0650) BP: (115-125)/(76-80) 120/80 mmHg (02/09 0650) SpO2:  [92 %-95 %] 95 % (02/09 0650) Last BM Date: 01/04/14  Intake/Output from previous day: 02/08 0701 - 02/09 0700 In: 2888.3 [P.O.:480; I.V.:2408.3] Out: 1200 [Urine:1200] Intake/Output this shift: Total I/O In: 320 [P.O.:320] Out: 200 [Urine:200]  PE: Abd: soft, appropriately tender, +BS, ND, incisions c/d/i  Lab Results:   Recent Labs  01/06/14 0520  WBC 11.0*  HGB 12.7*  HCT 37.9*  PLT 170   BMET  Recent Labs  01/05/14 0524 01/06/14 0520  NA 140 139  K 3.8 3.9  CL 104 104  CO2 18* 23  GLUCOSE 71 108*  BUN 11 4*  CREATININE 1.04 0.98  CALCIUM 8.2* 8.0*   PT/INR No results found for this basename: LABPROT, INR,  in the last 72 hours CMP     Component Value Date/Time   NA 139 01/06/2014 0520   K 3.9 01/06/2014 0520   CL 104 01/06/2014 0520   CO2 23 01/06/2014 0520   GLUCOSE 108* 01/06/2014 0520   BUN 4* 01/06/2014 0520   CREATININE 0.98 01/06/2014 0520   CALCIUM 8.0* 01/06/2014 0520   PROT 6.1 01/05/2014 0524   ALBUMIN 2.7* 01/05/2014 0524   AST 19 01/05/2014 0524   ALT 25 01/05/2014 0524   ALKPHOS 104 01/05/2014 0524   BILITOT 0.6 01/05/2014 0524   GFRNONAA >90 01/06/2014 0520   GFRAA >90 01/06/2014 0520   Lipase     Component Value Date/Time   LIPASE 236* 01/04/2014 0553       Studies/Results: Dg Cholangiogram Operative  01/05/2014   CLINICAL DATA:  Intra op evaluation  EXAM: INTRAOPERATIVE CHOLANGIOGRAM  TECHNIQUE: Cholangiographic images from the C-arm fluoroscopic device were submitted for interpretation post-operatively. Please see the procedural report for the  amount of contrast and the fluoroscopy time utilized.  COMPARISON:  None.  FINDINGS: Intraoperative spot images show normal caliber biliary system. No evidence of retained stone or obstruction. Free passage of contrast into the small bowel noted. There is evidence of mild reflux of contrast into the distal pancreatic duct.  IMPRESSION: Unremarkable intraoperative cholangiogram.   Electronically Signed   By: Margaree Mackintosh M.D.   On: 01/05/2014 11:02    Anti-infectives: Anti-infectives   Start     Dose/Rate Route Frequency Ordered Stop   01/05/14 0948  ceFAZolin (ANCEF) 2-3 GM-% IVPB SOLR    Comments:  Trixie Deis   : cabinet override      01/05/14 0948 01/05/14 1015       Assessment/Plan  1. POD 2, s/p lap chole secondary to gallstone pancreatitis  Plan: 1. Patient surgically stable for dc home.  Follow up already arranged.   LOS: 4 days    Sylar Voong E 01/07/2014, 10:33 AM Pager: [phone]

## 2014-01-07 NOTE — Discharge Summary (Signed)
Physician Discharge Summary  [name] POE:[id number] DOB: [birth year] DOA: 01/03/2014  PCP: No PCP Per Patient  Admit date: 01/03/2014 Discharge date: 01/07/2014  Time spent: 40 minutes  Recommendations for Outpatient Follow-up:  1. Followup with Gen. surgery as outpatient in one to 2 weeks  Discharge Diagnoses:  Active Problems:   Pancreatitis   Elevated LFTs   Low grade fever   Discharge Condition: Stable  Diet recommendation: General regular diet  Filed Weights   01/03/14 2105 01/05/14 0623  Weight: 58.015 kg (127 lb 14.4 oz) 60.782 kg (134 lb)    History of present illness:  Adam Shaw is a 53 y.o. male, who was recently discharged from [facility] on February 3, for acute pancreatitis, patient had extensive workup including a negative workup for gallstones, and unremarkable MRCP, and within normal limits blood workup including WNL IgG, IgA, IgM and ANA, the patient was discharged after resolution of his abdomen the pain, and when his lipase basically came back within normal limit, patient presents today with complaints of abdominal pain mild nausea but no vomiting, patient had significantly elevated lipase level, as well he denies any alcohol use, hospitalist service requested to admit the patient for further workup, his LFTs were mildly elevated this time.  Hospital Course:   Acute pancreatitis  -Recurrent acute pancreatitis, patient was recently discharged from the hospital.  -Recent extensive workup with MRCP, ultrasound, autoimmune workup and a lipid profile showed normal findings.  -Endoscopic ultrasound showed sludge, general surgery consulted and a laparoscopic cholecystectomy done on 2/7.  -Diet advanced, currently on regular diet no problems will be discharged home today.  Elevated LFTs  -Elevated AST/ALT and alkaline phosphatase, resolved overnight.  -This is likely secondary to the biliary sludge.  Low-grade fever  -Had a fever of 100.4 on 2/6,  last night low-grade fever of 99.4.  -Patient denies any cough, denies any urinary symptoms.  -Could be secondary to transient viral infection, RUQ is nontender and very unlikely to be biliary tree infection. -I deferred not to discharge her on antibiotics, followup with primary care physician if not resolving.   Procedures:  Laparoscopic cholecystectomy done on 01/05/2014 by Dr. Marlou Starks.  Consultations:  Gen. Surgery  Discharge Exam: Filed Vitals:   01/07/14 0650  BP: 120/80  Pulse: 69  Temp: 98.7 F (37.1 C)  Resp: 16   General: Alert and awake, oriented x3, not in any acute distress. HEENT: anicteric sclera, pupils reactive to light and accommodation, EOMI CVS: S1-S2 clear, no murmur rubs or gallops Chest: clear to auscultation bilaterally, no wheezing, rales or rhonchi Abdomen: soft nontender, nondistended, normal bowel sounds, no organomegaly Extremities: no cyanosis, clubbing or edema noted bilaterally Neuro: Cranial nerves II-XII intact, no focal neurological deficits  Discharge Instructions   Future Appointments Provider Department Dept Phone   01/29/2014 3:30 PM [facility], [place] [phone]       Medication List         multivitamin with minerals Tabs tablet  Take 1 tablet by mouth daily.     omeprazole 40 MG capsule  Commonly known as:  PRILOSEC  Take 1 capsule (40 mg total) by mouth daily.     oxyCODONE 5 MG immediate release tablet  Commonly known as:  Oxy IR/ROXICODONE  Take 1 tablet (5 mg total) by mouth every 4 (four) hours as needed for severe pain.       No Known Allergies     Follow-up Information  Follow up with [facility] [place] On 01/29/2014. (3:30pm, arrive at 3:00pm for paperwork)    Contact information:   [address]   [place] [address] [phone]        The results of significant diagnostics from this hospitalization (including imaging, microbiology, ancillary and  laboratory) are listed below for reference.    Significant Diagnostic Studies: Dg Chest 2 View  12/31/2013   CLINICAL DATA:  Epigastric pain and bloating.  EXAM: CHEST  2 VIEW  COMPARISON:  None available for comparison at time of study interpretation.  FINDINGS: Cardiomediastinal silhouette is unremarkable. The lungs are clear without pleural effusions or focal consolidations. Trace biapical pleural thickening. Trachea projects midline and there is no pneumothorax. Soft tissue planes and included osseous structures are non-suspicious. Multiple EKG lines overlie the patient and may obscure subtle underlying pathology.  IMPRESSION: No active cardiopulmonary disease.   Electronically Signed   By: Elon Alas   On: 12/31/2013 03:11   Dg Cholangiogram Operative  01/05/2014   CLINICAL DATA:  Intra op evaluation  EXAM: INTRAOPERATIVE CHOLANGIOGRAM  TECHNIQUE: Cholangiographic images from the C-arm fluoroscopic device were submitted for interpretation post-operatively. Please see the procedural report for the amount of contrast and the fluoroscopy time utilized.  COMPARISON:  None.  FINDINGS: Intraoperative spot images show normal caliber biliary system. No evidence of retained stone or obstruction. Free passage of contrast into the small bowel noted. There is evidence of mild reflux of contrast into the distal pancreatic duct.  IMPRESSION: Unremarkable intraoperative cholangiogram.   Electronically Signed   By: Margaree Mackintosh M.D.   On: 01/05/2014 11:02   US Abdomen Complete  12/31/2013   CLINICAL DATA:  Acute pancreatitis  EXAM: ULTRASOUND ABDOMEN COMPLETE  COMPARISON:  None.  FINDINGS: Gallbladder:  There is thickening of the gallbladder wall to 4 mm, with echogenic intramural foci with ring down artifact. No stone is seen. No focal tenderness.  Common bile duct:  Diameter: Measures up to 9 mm. No visible filling defect. The it is noted that there is no cholestasis on contemporaneously labs.  Liver:  No  focal lesion identified. Within normal limits in parenchymal echogenicity.  IVC:  No abnormality visualized.  Pancreas:  Visualized portion unremarkable.  Spleen:  Size and appearance within normal limits.  Right Kidney:  Length: 9 cm. 2 cm peripelvic, simple appearing cyst. No hydronephrosis.  Left Kidney:  Length: 9 cm. Echogenicity within normal limits. No mass or hydronephrosis visualized.  Abdominal aorta:  No aneurysm visualized.  Other findings:  None.  IMPRESSION: 1. No cholelithiasis. 2. Enlargement of the common bile duct to 9 mm. No visible choledocholithiasis. 3. Adenomyomatosis. 4. 2 cm right renal cyst.   Electronically Signed   By: Jorje Guild M.D.   On: 12/31/2013 05:41   Mr 3d Recon At Scanner  12/31/2013   CLINICAL DATA:  Epigastric abdominal pain. Dilated common bile duct on ultrasound.  EXAM: MRI ABDOMEN WITHOUT AND WITH CONTRAST (INCLUDING MRCP)  TECHNIQUE: Multiplanar multisequence MR imaging of the abdomen was performed both before and after the administration of intravenous contrast. Heavily T2-weighted images of the biliary and pancreatic ducts were obtained, and three-dimensional MRCP images were rendered by post processing.  CONTRAST:  37mL MULTIHANCE GADOBENATE DIMEGLUMINE 529 MG/ML IV SOLN  COMPARISON:  MR MRCP WO/W CM dated 12/31/2013; US ABDOMEN COMPLETE dated 12/31/2013  FINDINGS: Probable volume loss and atelectasis the anterior right lung base. Normal liver, spleen. Underdistended proximal stomach.  Normal gallbladder. Normal pancreas, without pancreatic ductal dilatation.  The intrahepatic ducts are minimally dilated, including a 4 mm left hepatic duct on image 75/series 11. The common duct measures 7 mm maximally in the porta hepatis on image 67/series 11. Upper normal 6 mm in this age group. Tapers to 5 mm in the region of the pancreatic head. No evidence of obstructive stone or mass.  Normal adrenal glands. Tiny bilateral renal cysts with a cyst or minimally complex cyst in  the interpolar right kidney measuring 1.6 cm.  No abdominal adenopathy or ascites.  IMPRESSION: 1. Minimal intra and extrahepatic biliary ductal dilatation, without evidence of obstructive stone or mass. This could be within normal variation for this patient. If there is a high clinical concern of otherwise occult ampullary stenosis or ampullary lesion, ERCP should be considered. 2. No other explanation for epigastric abdominal pain.   Electronically Signed   By: Abigail Miyamoto M.D.   On: 12/31/2013 13:33   Mr Jeananne Rama W/wo Cm/mrcp  12/31/2013   CLINICAL DATA:  Epigastric abdominal pain. Dilated common bile duct on ultrasound.  EXAM: MRI ABDOMEN WITHOUT AND WITH CONTRAST (INCLUDING MRCP)  TECHNIQUE: Multiplanar multisequence MR imaging of the abdomen was performed both before and after the administration of intravenous contrast. Heavily T2-weighted images of the biliary and pancreatic ducts were obtained, and three-dimensional MRCP images were rendered by post processing.  CONTRAST:  61mL MULTIHANCE GADOBENATE DIMEGLUMINE 529 MG/ML IV SOLN  COMPARISON:  MR MRCP WO/W CM dated 12/31/2013; US ABDOMEN COMPLETE dated 12/31/2013  FINDINGS: Probable volume loss and atelectasis the anterior right lung base. Normal liver, spleen. Underdistended proximal stomach.  Normal gallbladder. Normal pancreas, without pancreatic ductal dilatation.  The intrahepatic ducts are minimally dilated, including a 4 mm left hepatic duct on image 75/series 11. The common duct measures 7 mm maximally in the porta hepatis on image 67/series 11. Upper normal 6 mm in this age group. Tapers to 5 mm in the region of the pancreatic head. No evidence of obstructive stone or mass.  Normal adrenal glands. Tiny bilateral renal cysts with a cyst or minimally complex cyst in the interpolar right kidney measuring 1.6 cm.  No abdominal adenopathy or ascites.  IMPRESSION: 1. Minimal intra and extrahepatic biliary ductal dilatation, without evidence of obstructive stone  or mass. This could be within normal variation for this patient. If there is a high clinical concern of otherwise occult ampullary stenosis or ampullary lesion, ERCP should be considered. 2. No other explanation for epigastric abdominal pain.   Electronically Signed   By: Abigail Miyamoto M.D.   On: 12/31/2013 13:33   US Abdomen Limited Ruq  01/03/2014   CLINICAL DATA:  53 year old male with recurrent pancreatitis. Epigastric pain. Initial encounter. Biliary ductal dilatation on recent exams.  EXAM: US ABDOMEN LIMITED - RIGHT UPPER QUADRANT  COMPARISON:  MRCP 12/31/2013.  Ultrasound of the abdomen 12/31/2013.  FINDINGS: Gallbladder:  Wall thickening up to 3 mm, not significantly changed. Evidence of adenomyomatosis, but was more apparent on the 12/31/2013 [place]. No pericholecystic fluid. No sonographic Murphy sign elicited.  Common bile duct:  Diameter: Up to 8 mm diameter today, previously 8-9 mm by ultrasound and MRCP. Suggestion of mild wall thickening of the CBD, unchanged.  Liver:  Stable to decreased mild intrahepatic ductal dilatation. No discrete liver lesion. No perihepatic fluid.  Pancreas: No pancreatic ductal enlargement. Parenchymal echotexture within normal limits.  IMPRESSION: 1. Essentially stable since the MRCP on 12/31/2013; intra and extrahepatic biliary ductal enlargement  is stable to mildly decreased. 2. Gallbladder adenomyomatosis. 3. Negative sonographic appearance of the pancreas.   Electronically Signed   By: Lars Pinks M.D.   On: 01/03/2014 20:23    Microbiology: No results found for this or any previous visit (from the past 240 hour(s)).   Labs: Basic Metabolic Panel:  Recent Labs Lab 01/01/14 0531 01/03/14 1800 01/04/14 0553 01/05/14 0524 01/06/14 0520  NA 142 140 142 140 139  K 4.2 3.7 3.9 3.8 3.9  CL 108 99 108 104 104  CO2 24 28 23  18* 23  GLUCOSE 91 162* 91 71 108*  BUN 8 12 8 11  4*  CREATININE 0.95 0.96 0.92 1.04 0.98  CALCIUM 8.3* 9.4 8.0* 8.2* 8.0*   Liver  Function Tests:  Recent Labs Lab 01/01/14 0531 01/03/14 1800 01/04/14 0553 01/05/14 0524  AST 18 89* 29 19  ALT 20 57* 34 25  ALKPHOS 92 149* 106 104  BILITOT 0.5 0.5 0.5 0.6  PROT 6.1 8.1 6.0 6.1  ALBUMIN 2.8* 3.8 2.7* 2.7*    Recent Labs Lab 01/01/14 0531 01/03/14 1800 01/04/14 0553  LIPASE 24 1828* 236*   No results found for this basename: AMMONIA,  in the last 168 hours CBC:  Recent Labs Lab 01/01/14 0531 01/03/14 1800 01/04/14 0553 01/06/14 0520  WBC 7.1 11.6* 7.6 11.0*  NEUTROABS  --  9.9*  --   --   HGB 13.2 14.9 12.4* 12.7*  HCT 39.5 43.2 36.7* 37.9*  MCV 82.6 81.8 81.6 81.9  PLT 174 206 162 170   Cardiac Enzymes: No results found for this basename: CKTOTAL, CKMB, CKMBINDEX, TROPONINI,  in the last 168 hours BNP: BNP (last 3 results) No results found for this basename: PROBNP,  in the last 8760 hours CBG: No results found for this basename: GLUCAP,  in the last 168 hours     Signed:  Courtni Balash A  [facility] 01/07/2014, 11:41 AM

## 2014-01-29 ENCOUNTER — Encounter (INDEPENDENT_AMBULATORY_CARE_PROVIDER_SITE_OTHER): Payer: BC Managed Care – PPO

## 2014-02-19 ENCOUNTER — Encounter (INDEPENDENT_AMBULATORY_CARE_PROVIDER_SITE_OTHER): Payer: Self-pay

## 2014-02-19 ENCOUNTER — Ambulatory Visit (INDEPENDENT_AMBULATORY_CARE_PROVIDER_SITE_OTHER): Payer: BC Managed Care – PPO | Admitting: General Surgery

## 2014-02-19 VITALS — BP 124/76 | HR 75 | Temp 97.3°F | Resp 16 | Ht 66.0 in | Wt 129.0 lb

## 2014-02-19 DIAGNOSIS — D135 Benign neoplasm of extrahepatic bile ducts: Secondary | ICD-10-CM

## 2014-02-19 DIAGNOSIS — Z9049 Acquired absence of other specified parts of digestive tract: Secondary | ICD-10-CM

## 2014-02-19 DIAGNOSIS — D134 Benign neoplasm of liver: Secondary | ICD-10-CM

## 2014-02-19 DIAGNOSIS — R509 Fever, unspecified: Secondary | ICD-10-CM

## 2014-02-19 DIAGNOSIS — Z9889 Other specified postprocedural states: Secondary | ICD-10-CM

## 2014-02-19 DIAGNOSIS — R945 Abnormal results of liver function studies: Secondary | ICD-10-CM

## 2014-02-19 DIAGNOSIS — R7989 Other specified abnormal findings of blood chemistry: Secondary | ICD-10-CM

## 2014-02-19 DIAGNOSIS — K859 Acute pancreatitis without necrosis or infection, unspecified: Secondary | ICD-10-CM

## 2014-02-19 NOTE — Progress Notes (Signed)
  Subjective: Adam Shaw is a 53 y.o. male who had a laparoscopic cholecystectomy  on 01/05/14 by Dr. Marlou Starks  returns to the clinic today.  Pathology reveals benign gallbladder with adenomyoma and chronic inflammation, negative for cholelithiasis, no tumor.  The patient is tolerating their diet well and is having no severe pain.  Bowel function is good.  The pre-operative symptoms of abdominal pain, nausea, and vomiting have resolved.  No problems with the wounds.  Pt is returning to normal activity / work.   Objective: Vital signs in last 24 hours: Reviewed   PE: General:  Alert, NAD, pleasant Abdomen:  soft, NT/ND, +bs, incisions appear well-healed with no sign of infection or bleeding.  Nodularity over umbilical and lateral incision site, but no signs of infection.   Assessment/Plan  1.  S/P Laparoscopic Cholecystectomy: doing well, may resume regular activity without restrictions, Pt will follow up with [place] PRN and knows to call with questions or concerns.      Coralie Keens, PA-C 02/19/2014

## 2014-02-19 NOTE — Patient Instructions (Signed)
He may resume a regular diet and full activity.  He may follow-up on a PRN basis.

## 2014-05-04 IMAGING — RF DG CHOLANGIOGRAM OPERATIVE
1 series · 5 of 5 positions shown · non-contrast
Comparison: None.

CLINICAL DATA: Intra op evaluation

EXAM:
INTRAOPERATIVE CHOLANGIOGRAM
TECHNIQUE: Cholangiographic images from the C-arm fluoroscopic device were
submitted for interpretation post-operatively. Please see the
procedural report for the amount of contrast and the fluoroscopy
time utilized.

[Series 1: run · 2 acquisitions, 5 frames shown]
[im 1/2]
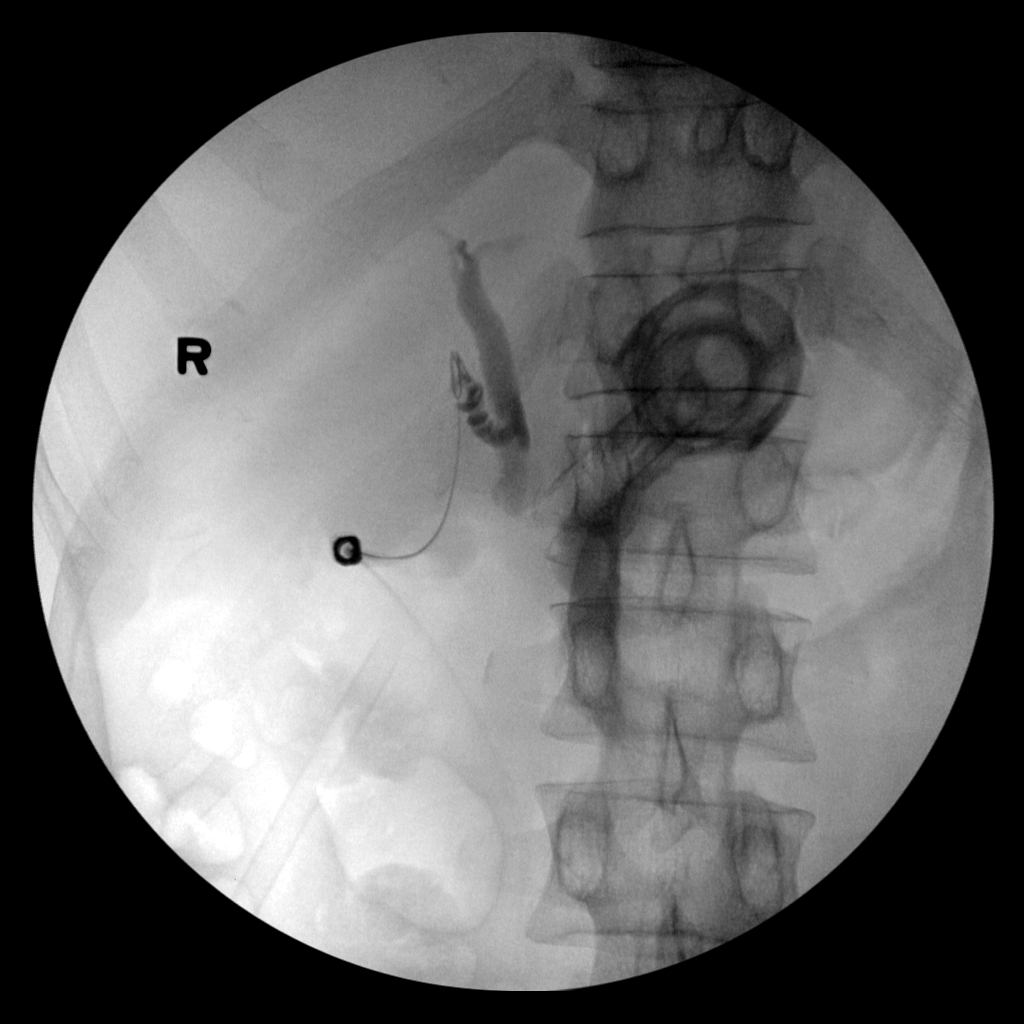
[im 1/2]
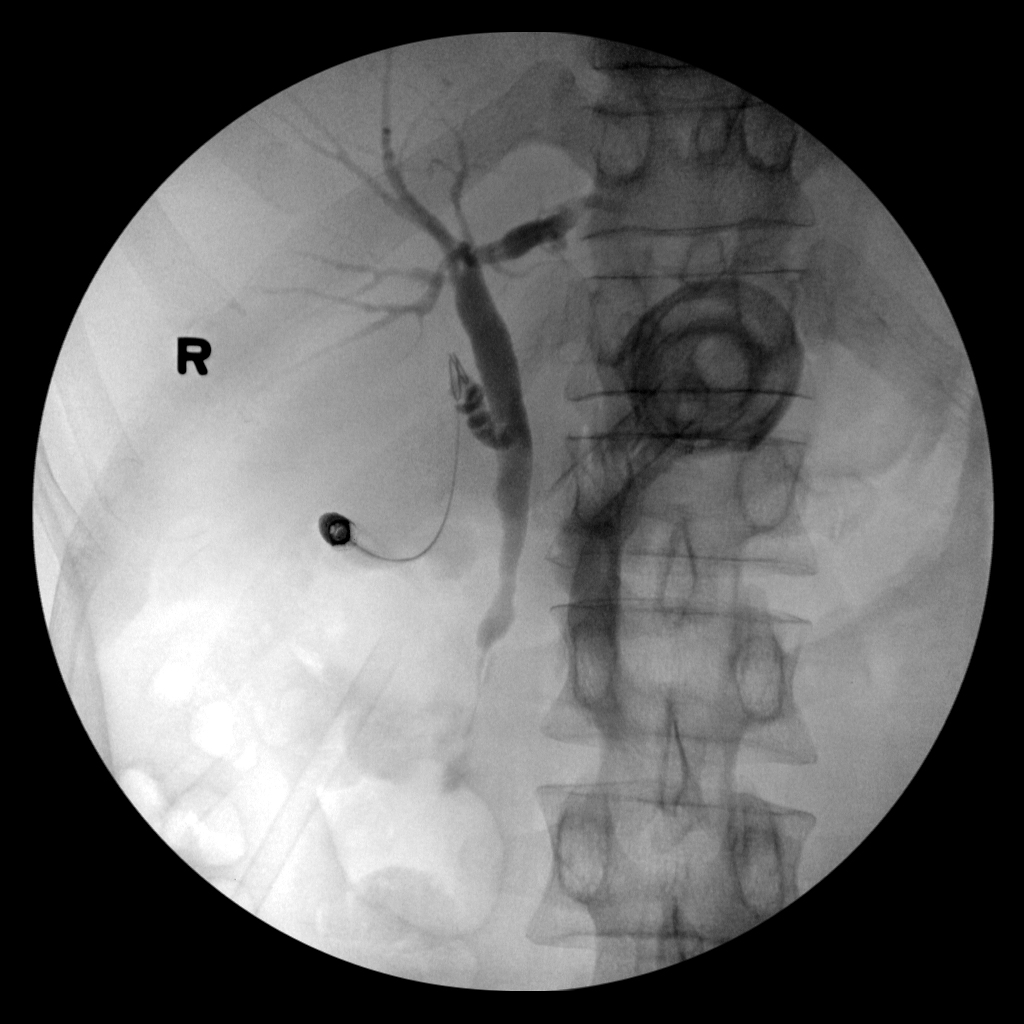
[im 1/2]
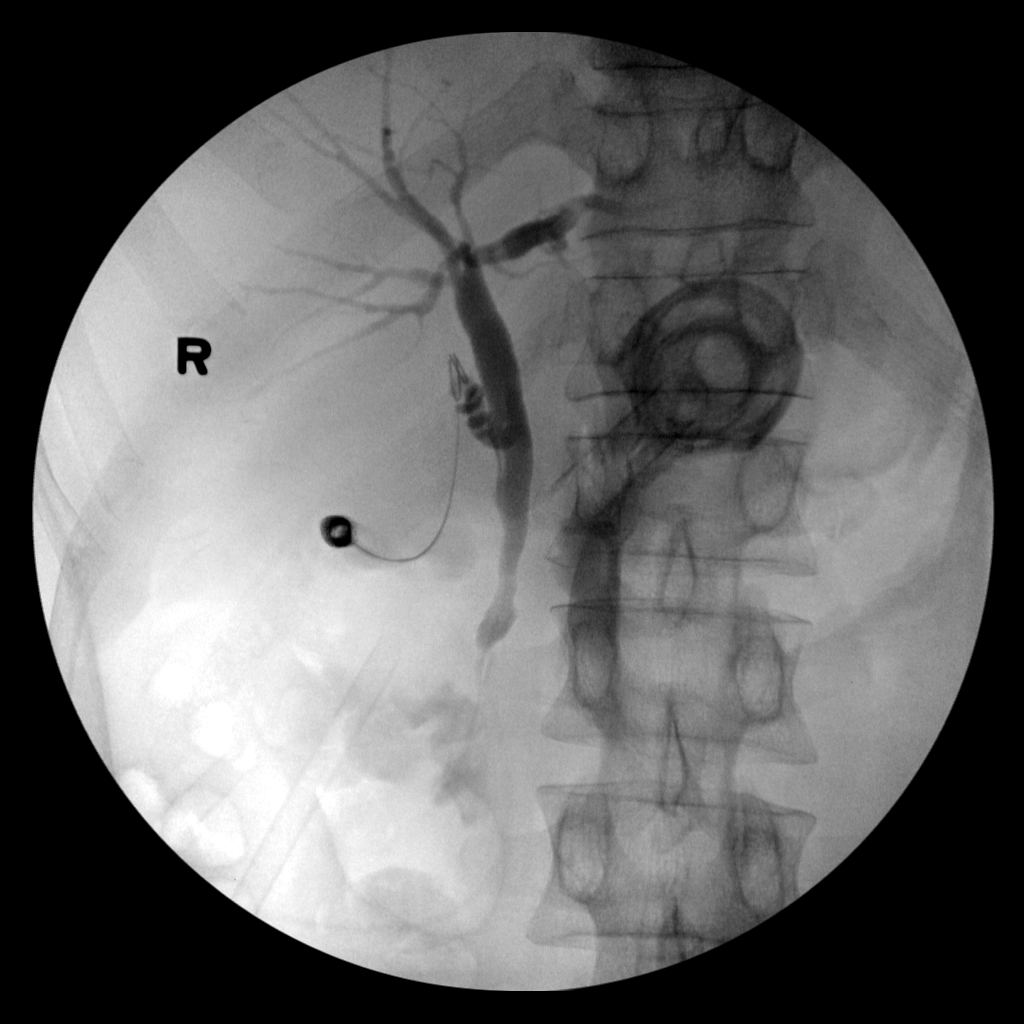
[im 1/2]
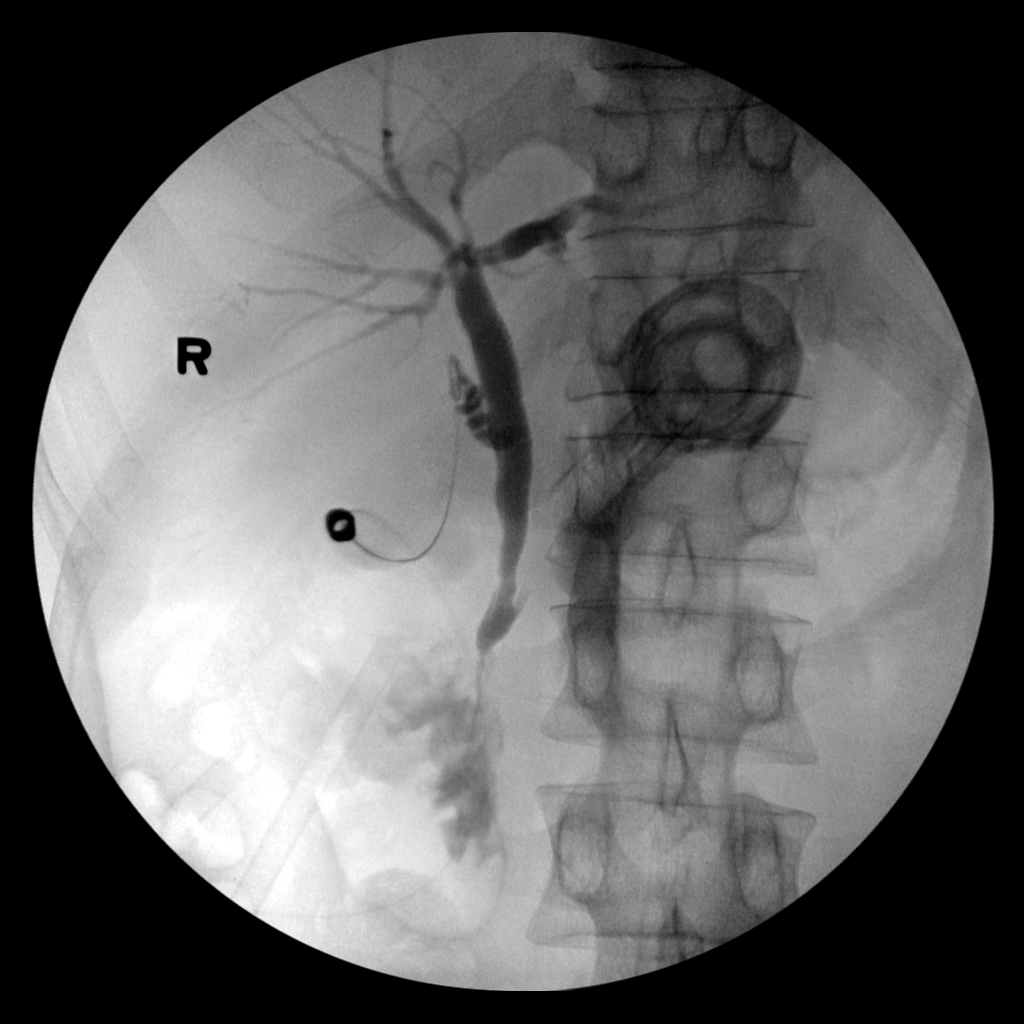
[im 2/2]
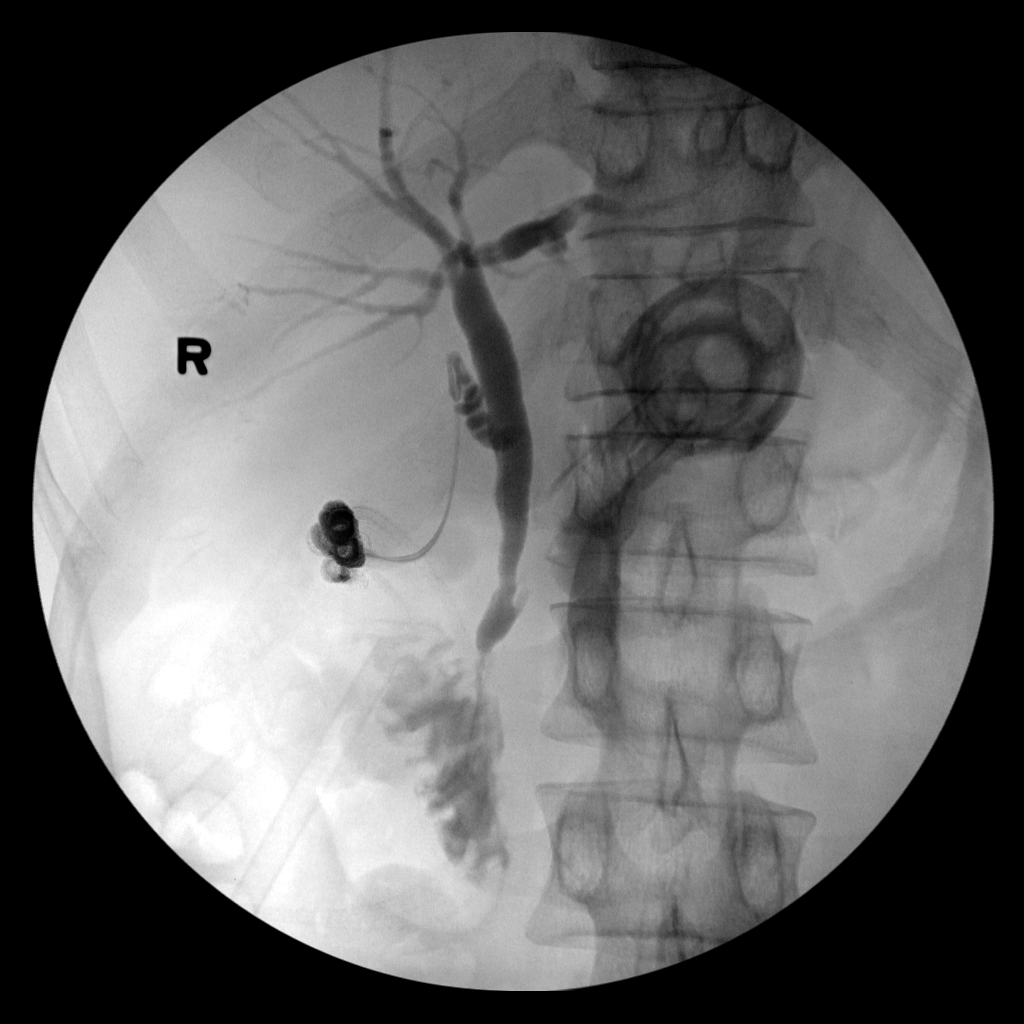

[5 of 5 positions shown; findings below may reference images not displayed]

FINDINGS: Intraoperative spot images show normal caliber biliary system. No
evidence of retained stone or obstruction. Free passage of contrast
into the small bowel noted. There is evidence of mild reflux of
contrast into the distal pancreatic duct.
IMPRESSION: Unremarkable intraoperative cholangiogram.

## 2016-07-07 ENCOUNTER — Ambulatory Visit: Payer: Self-pay | Admitting: Surgery

## 2016-07-07 NOTE — H&P (Signed)
Adam Shaw 07/07/2016 2:36 PM Location: [facility] Patient #: [id number] DOB: 10/26/1961 Married / Language: English / Race: Black or African American Male  History of Present Illness Adam Shaw; 07/07/2016 3:13 PM) The patient is a 55 year old male who presents with an inguinal hernia. Note for "Inguinal hernia": Patient sent for surgical consultation by Dr. Sandi Mariscal, physician Assistant. [facility]. Concern for left inguinal hernia.  Pleasant active male. 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 Noticed a lump about a month ago. More prominent when he is more active. Usually comes back in. He believes his father told him he had pediatric groin hernia repairs when he was very very young. Had cholecystectomy done in 2015 for gallstone pancreatitis. No other abdominal surgeries. Because of concern of the worsening groin swelling, he went saw his primary care physician. Concern for inguinal hernia. Surgical consultation recommended.  Moderately active. Shortness of breath or exertional chest pain. Usually moves his bowels every 1-2 days. Again he does not smoke. No history of skin infection. No problems with urination or defecation. No hesitancy or difficulty starting stream. No nocturia.   Allergies Davy Pique Shaw, CMA; 07/07/2016 2:36 PM) No Known Drug Allergies 07/07/2016  Medication History (Adam Shaw, CMA; 07/07/2016 2:37 PM) Multivitamin Adult (Oral) Active. Medications Reconciled    Vitals (Adam Shaw CMA; 07/07/2016 2:36 PM) 07/07/2016 2:36 PM Weight: 132 lb Height: 66in Body Surface Area: 1.68 m Body Mass Index: 21.31 kg/m  Temp.: 45F(Temporal)  Pulse: 62 (Regular)  BP: 124/76 (Sitting, Left Arm, Standard)      Physical Exam Adam Shaw; 07/07/2016 3:03 PM)  General Mental Status-Alert. General Appearance-Not in acute distress, Not Sickly. Orientation-Oriented  X3. Hydration-Well hydrated. Voice-Normal.  Integumentary Global Assessment Upon inspection and palpation of skin surfaces of the - Axillae: non-tender, no inflammation or ulceration, no drainage. and Distribution of scalp and body hair is normal. General Characteristics Temperature - normal warmth is noted.  Head and Neck Head-normocephalic, atraumatic with no lesions or palpable masses. Face Global Assessment - atraumatic, no absence of expression. Neck Global Assessment - no abnormal movements, no bruit auscultated on the right, no bruit auscultated on the left, no decreased range of motion, non-tender. Trachea-midline. Thyroid Gland Characteristics - non-tender.  Eye Eyeball - Left-Extraocular movements intact, No Nystagmus. Eyeball - Right-Extraocular movements intact, No Nystagmus. Cornea - Left-No Hazy. Cornea - Right-No Hazy. Sclera/Conjunctiva - Left-No scleral icterus, No Discharge. Sclera/Conjunctiva - Right-No scleral icterus, No Discharge. Pupil - Left-Direct reaction to light normal. Pupil - Right-Direct reaction to light normal.  ENMT Ears Pinna - Left - no drainage observed, no generalized tenderness observed. Right - no drainage observed, no generalized tenderness observed. Nose and Sinuses External Inspection of the Nose - no destructive lesion observed. Inspection of the nares - Left - quiet respiration. Right - quiet respiration. Mouth and Throat Lips - Upper Lip - no fissures observed, no pallor noted. Lower Lip - no fissures observed, no pallor noted. Nasopharynx - no discharge present. Oral Cavity/Oropharynx - Tongue - no dryness observed. Oral Mucosa - no cyanosis observed. Hypopharynx - no evidence of airway distress observed.  Chest and Lung Exam Inspection Movements - Normal and Symmetrical. Accessory muscles - No use of accessory muscles in breathing. Palpation Palpation of the chest reveals -  Non-tender. Auscultation Breath sounds - Normal and Clear.  Cardiovascular Auscultation Rhythm - Regular. Murmurs & Other Heart Sounds - Auscultation of the heart reveals - No Murmurs and  No Systolic Clicks.  Abdomen Inspection Inspection of the abdomen reveals - No Visible peristalsis and No Abnormal pulsations. Umbilicus - No Bleeding, No Urine drainage. Palpation/Percussion Palpation and Percussion of the abdomen reveal - Soft, Non Tender, No Rebound tenderness, No Rigidity (guarding) and No Cutaneous hyperesthesia. Note: Abdomen soft. Small laparoscopic upper abdominal incisions. Nontender, nondistended. No guarding. No umbilical hernias  Male Genitourinary Sexual Maturity Tanner 5 - Adult hair pattern and Adult penile size and shape. Note: Left greater than right inguinal hernias on Valsalva. Reducible. Possible left femoral as well. Small groin incisions consistent with probable prior pediatric repair. Normal external genitalia. Epididymi, testes, and spermatic cords normal without any masses.  Peripheral Vascular Upper Extremity Inspection - Left - No Cyanotic nailbeds, Not Ischemic. Right - No Cyanotic nailbeds, Not Ischemic.  Neurologic Neurologic evaluation reveals -normal attention span and ability to concentrate, able to name objects and repeat phrases. Appropriate fund of knowledge , normal sensation and normal coordination. Mental Status Affect - not angry, not paranoid. Cranial Nerves-Normal Bilaterally. Gait-Normal.  Neuropsychiatric Mental status exam performed with findings of-able to articulate well with normal speech/language, rate, volume and coherence, thought content normal with ability to perform basic computations and apply abstract reasoning and no evidence of hallucinations, delusions, obsessions or homicidal/suicidal ideation.  Musculoskeletal Global Assessment Spine, Ribs and Pelvis - no instability, subluxation or laxity. Right Upper  Extremity - no instability, subluxation or laxity.  Lymphatic Head & Neck  General Head & Neck Lymphatics: Bilateral - Description - No Localized lymphadenopathy. Axillary  General Axillary Region: Bilateral - Description - No Localized lymphadenopathy. Femoral & Inguinal  Generalized Femoral & Inguinal Lymphatics: Left - Description - No Localized lymphadenopathy. Right - Description - No Localized lymphadenopathy.    Assessment & Plan Adam Shaw; 07/07/2016 3:14 PM)  BILATERAL RECURRENT INGUINAL HERNIA WITHOUT OBSTRUCTION OR GANGRENE (K40.21) Impression: Left greater than right recurrent bilateral inguinal hernias most likely from prior pediatric repair in a moderately active male.  I think it would benefit from surgical repair. We do laparoscopic underlay report with mesh. Trying to set this up at a mutually convenient time. He iIs interested in proceeding.  Because he's not having severe debilitating pain, reasonable to go back to work unrestricted for now. Okay to use anti-inflammatories over-the-counter such as ibuprofen/naproxen/acetaminophen. Ice or Heat as needed. We will send a note as well.  Current Plans Pt Education - [facility] Pain [facility] (Anabeth Chilcott) PREOP - ING HERNIA - ENCOUNTER FOR PREOPERATIVE EXAMINATION FOR GENERAL SURGICAL PROCEDURE (Z01.818)  Current Plans You are being scheduled for surgery - Our schedulers will call you.  You should hear from our office's scheduling department within 5 working days about the location, date, and time of surgery. We try to make accommodations for patient's preferences in scheduling surgery, but sometimes the OR schedule or the surgeon's schedule prevents [place] from making those accommodations.  If you have not heard from our office [phone]) in 5 working days, call the office and ask for your surgeon's nurse.  If you have other questions about your diagnosis, plan, or surgery, call the office and ask for your surgeon's  nurse.  Written instructions provided The anatomy & physiology of the abdominal wall and pelvic floor was discussed. The pathophysiology of hernias in the inguinal and pelvic region was discussed. Natural history risks such as progressive enlargement, pain, incarceration, and strangulation was discussed. Contributors to complications such as smoking, obesity, diabetes, prior surgery, etc were discussed.  I feel the risks of  no intervention will lead to serious problems that outweigh the operative risks; therefore, I recommended surgery to reduce and repair the hernia. I explained laparoscopic techniques with possible need for an open approach. I noted usual use of mesh to patch and/or buttress hernia repair  Risks such as bleeding, infection, abscess, need for further treatment, heart attack, death, and other risks were discussed. I noted a good likelihood this will help address the problem. Goals of post-operative recovery were discussed as well. Possibility that this will not correct all symptoms was explained. I stressed the importance of low-impact activity, aggressive pain control, avoiding constipation, & not pushing through pain to minimize risk of post-operative chronic pain or injury. Possibility of reherniation was discussed. We will work to minimize complications.  An educational handout further explaining the pathology & treatment options was given as well. Questions were answered. The patient expresses understanding & wishes to proceed with surgery.  Pt Education - Pamphlet Given - Laparoscopic Hernia Repair: discussed with patient and provided information. Pt Education - [facility] Pain Control (Desean Heemstra) Pt Education - [facility] Hernia Post-Op HCI (Braycen Burandt): discussed with patient and provided information.  Adam Hector, M.D., F.A.C.S. Gastrointestinal and Minimally Invasive Surgery [facility] [address] [place], [place] [address] [phone] Main /  Paging

## 2016-12-09 ENCOUNTER — Ambulatory Visit: Payer: Self-pay | Admitting: Surgery

## 2016-12-09 NOTE — H&P (Signed)
Adam Shaw 07/07/2016 2:36 PM Location: [facility] Patient #: [id number] DOB: 07/16/1961 Married / Language: English / Race: Black or African American Male   History of Present Illness  The patient is a 56 year old male who presents with an inguinal hernia.   Patient sent for surgical consultation by Dr. Sandi Mariscal, physician Assistant. [facility]. Concern for left inguinal hernia.  Pleasant active male. 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 Noticed a lump about a month ago. More prominent when he is more active. Usually comes back in. He believes his father told him he had pediatric groin hernia repairs when he was very very young. Had cholecystectomy done in 2015 for gallstone pancreatitis. No other abdominal surgeries. Because of concern of the worsening groin swelling, he went saw his primary care physician. Concern for inguinal hernia. Surgical consultation recommended.  Moderately active. Shortness of breath or exertional chest pain. Usually moves his bowels every 1-2 days. Again he does not smoke. No history of skin infection. No problems with urination or defecation. No hesitancy or difficulty starting stream. No nocturia.  No new events.  Rated to consider surgery now.    Allergies Davy Pique Bynum, CMA; 07/07/2016 2:36 PM) No Known Drug Allergies 07/07/2016  Medication History ([name], CMA; 07/07/2016 2:37 PM) Multivitamin Adult (Oral) Active. Medications Reconciled  Vitals ([name] CMA; 07/07/2016 2:36 PM) 07/07/2016 2:36 PM Weight: 132 lb Height: 66in Body Surface Area: 1.68 m Body Mass Index: 21.31 kg/m  Temp.: 34F(Temporal)  Pulse: 62 (Regular)  BP: 124/76 (Sitting, Left Arm, Standard)       Physical Exam [name]; 07/07/2016 3:03 PM) General Mental Status-Alert. General Appearance-Not in acute distress, Not Sickly. Orientation-Oriented X3. Hydration-Well  hydrated. Voice-Normal.  Integumentary Global Assessment Upon inspection and palpation of skin surfaces of the - Axillae: non-tender, no inflammation or ulceration, no drainage. and Distribution of scalp and body hair is normal. General Characteristics Temperature - normal warmth is noted.  Head and Neck Head-normocephalic, atraumatic with no lesions or palpable masses. Face Global Assessment - atraumatic, no absence of expression. Neck Global Assessment - no abnormal movements, no bruit auscultated on the right, no bruit auscultated on the left, no decreased range of motion, non-tender. Trachea-midline. Thyroid Gland Characteristics - non-tender.  Eye Eyeball - Left-Extraocular movements intact, No Nystagmus. Eyeball - Right-Extraocular movements intact, No Nystagmus. Cornea - Left-No Hazy. Cornea - Right-No Hazy. Sclera/Conjunctiva - Left-No scleral icterus, No Discharge. Sclera/Conjunctiva - Right-No scleral icterus, No Discharge. Pupil - Left-Direct reaction to light normal. Pupil - Right-Direct reaction to light normal.  ENMT Ears Pinna - Left - no drainage observed, no generalized tenderness observed. Right - no drainage observed, no generalized tenderness observed. Nose and Sinuses External Inspection of the Nose - no destructive lesion observed. Inspection of the nares - Left - quiet respiration. Right - quiet respiration. Mouth and Throat Lips - Upper Lip - no fissures observed, no pallor noted. Lower Lip - no fissures observed, no pallor noted. Nasopharynx - no discharge present. Oral Cavity/Oropharynx - Tongue - no dryness observed. Oral Mucosa - no cyanosis observed. Hypopharynx - no evidence of airway distress observed.  Chest and Lung Exam Inspection Movements - Normal and Symmetrical. Accessory muscles - No use of accessory muscles in breathing. Palpation Palpation of the chest reveals - Non-tender. Auscultation Breath sounds - Normal  and Clear.  Cardiovascular Auscultation Rhythm - Regular. Murmurs & Other Heart Sounds - Auscultation of the heart reveals - No  Murmurs and No Systolic Clicks.  Abdomen Inspection Inspection of the abdomen reveals - No Visible peristalsis and No Abnormal pulsations. Umbilicus - No Bleeding, No Urine drainage. Palpation/Percussion Palpation and Percussion of the abdomen reveal - Soft, Non Tender, No Rebound tenderness, No Rigidity (guarding) and No Cutaneous hyperesthesia. Note: Abdomen soft. Small laparoscopic upper abdominal incisions. Nontender, nondistended. No guarding. No umbilical hernias   Male Genitourinary Sexual Maturity Tanner 5 - Adult hair pattern and Adult penile size and shape. Note: Left greater than right inguinal hernias on Valsalva. Reducible. Possible left femoral as well. Small groin incisions consistent with probable prior pediatric repair. Normal external genitalia. Epididymi, testes, and spermatic cords normal without any masses.   Peripheral Vascular Upper Extremity Inspection - Left - No Cyanotic nailbeds, Not Ischemic. Right - No Cyanotic nailbeds, Not Ischemic.  Neurologic Neurologic evaluation reveals -normal attention span and ability to concentrate, able to name objects and repeat phrases. Appropriate fund of knowledge , normal sensation and normal coordination. Mental Status Affect - not angry, not paranoid. Cranial Nerves-Normal Bilaterally. Gait-Normal.  Neuropsychiatric Mental status exam performed with findings of-able to articulate well with normal speech/language, rate, volume and coherence, thought content normal with ability to perform basic computations and apply abstract reasoning and no evidence of hallucinations, delusions, obsessions or homicidal/suicidal ideation.  Musculoskeletal Global Assessment Spine, Ribs and Pelvis - no instability, subluxation or laxity. Right Upper Extremity - no instability, subluxation or  laxity.  Lymphatic Head & Neck  General Head & Neck Lymphatics: Bilateral - Description - No Localized lymphadenopathy. Axillary  General Axillary Region: Bilateral - Description - No Localized lymphadenopathy. Femoral & Inguinal  Generalized Femoral & Inguinal Lymphatics: Left - Description - No Localized lymphadenopathy. Right - Description - No Localized lymphadenopathy.    Assessment & Plan  BILATERAL RECURRENT INGUINAL HERNIA WITHOUT OBSTRUCTION OR GANGRENE (K40.21) Impression: Left greater than right recurrent bilateral inguinal hernias in a moderately active male.  I think it would benefit from surgical repair. We do laparoscopic underlay report with mesh. Trying to set this up at a mutually convenient time. He iIs interested in proceeding.  Because he's not having severe debilitating pain, reasonable to go back to work unrestricted for now. Okay to use anti-inflammatories over-the-counter such as ibuprofen/naproxen/acetaminophen. Ice or Heat as needed. We will send a note as well.  He delayed surgery.  Ready to consider surgery now.  Current Plans Pt Education - [facility] Pain [facility] (Devona Holmes)  PREOP - ING HERNIA - ENCOUNTER FOR PREOPERATIVE EXAMINATION FOR GENERAL SURGICAL PROCEDURE (Z01.818) Current Plans You are being scheduled for surgery - Our schedulers will call you.  You should hear from our office's scheduling department within 5 working days about the location, date, and time of surgery. We try to make accommodations for patient's preferences in scheduling surgery, but sometimes the OR schedule or the surgeon's schedule prevents [place] from making those accommodations.  If you have not heard from our office [phone]) in 5 working days, call the office and ask for your surgeon's nurse.  If you have other questions about your diagnosis, plan, or surgery, call the office and ask for your surgeon's nurse.  Written instructions provided The anatomy & physiology of the  abdominal wall and pelvic floor was discussed. The pathophysiology of hernias in the inguinal and pelvic region was discussed. Natural history risks such as progressive enlargement, pain, incarceration, and strangulation was discussed. Contributors to complications such as smoking, obesity, diabetes, prior surgery, etc were discussed.  I feel the risks  of no intervention will lead to serious problems that outweigh the operative risks; therefore, I recommended surgery to reduce and repair the hernia. I explained laparoscopic techniques with possible need for an open approach. I noted usual use of mesh to patch and/or buttress hernia repair  Risks such as bleeding, infection, abscess, need for further treatment, heart attack, death, and other risks were discussed. I noted a good likelihood this will help address the problem. Goals of post-operative recovery were discussed as well. Possibility that this will not correct all symptoms was explained. I stressed the importance of low-impact activity, aggressive pain control, avoiding constipation, & not pushing through pain to minimize risk of post-operative chronic pain or injury. Possibility of reherniation was discussed. We will work to minimize complications.  An educational handout further explaining the pathology & treatment options was given as well. Questions were answered. The patient expresses understanding & wishes to proceed with surgery.  Pt Education - Pamphlet Given - Laparoscopic Hernia Repair: discussed with patient and provided information. Pt Education - [facility] Pain Control (Orilla Templeman) Pt Education - [facility] Hernia Post-Op HCI (Rinaldo Macqueen): discussed with patient and provided information.

## 2016-12-09 NOTE — Progress Notes (Signed)
Scheduling pre op--please place SURGICAL ORDERS INN EPIC  Thanks

## 2016-12-16 ENCOUNTER — Encounter (HOSPITAL_COMMUNITY): Payer: Self-pay | Admitting: *Deleted

## 2016-12-20 ENCOUNTER — Encounter (HOSPITAL_COMMUNITY)
Admission: RE | Admit: 2016-12-20 | Discharge: 2016-12-20 | Disposition: A | Discharge status: Home / Self Care | Payer: Commercial Managed Care - PPO | Source: Ambulatory Visit | Attending: Surgery | Admitting: Surgery

## 2016-12-20 DIAGNOSIS — K859 Acute pancreatitis without necrosis or infection, unspecified: Secondary | ICD-10-CM

## 2016-12-20 DIAGNOSIS — Z01812 Encounter for preprocedural laboratory examination: Secondary | ICD-10-CM | POA: Insufficient documentation

## 2016-12-20 DIAGNOSIS — R7989 Other specified abnormal findings of blood chemistry: Secondary | ICD-10-CM

## 2016-12-20 DIAGNOSIS — K4020 Bilateral inguinal hernia, without obstruction or gangrene, not specified as recurrent: Secondary | ICD-10-CM

## 2016-12-20 DIAGNOSIS — D176 Benign lipomatous neoplasm of spermatic cord: Secondary | ICD-10-CM | POA: Diagnosis not present

## 2016-12-20 LAB — CBC
HEMATOCRIT: 45 % (ref 39.0–52.0)
HEMOGLOBIN: 15.1 g/dL (ref 13.0–17.0)
MCH: 27.4 pg (ref 26.0–34.0)
MCHC: 33.6 g/dL (ref 30.0–36.0)
MCV: 81.7 fL (ref 78.0–100.0)
Platelets: 165 10*3/uL (ref 150–400)
RBC: 5.51 MIL/uL (ref 4.22–5.81)
RDW: 13.5 % (ref 11.5–15.5)
WBC: 6.5 10*3/uL (ref 4.0–10.5)

## 2016-12-20 NOTE — Progress Notes (Signed)
Patient came in for unscheduled preop appointment.  Labs completed along with consent and patient given hibiclens.  Medical history and instructions had been completed prior to preop appointment over phone prior to preop appt.

## 2016-12-22 ENCOUNTER — Ambulatory Visit (HOSPITAL_COMMUNITY): Payer: Commercial Managed Care - PPO | Admitting: Anesthesiology

## 2016-12-22 ENCOUNTER — Encounter (HOSPITAL_COMMUNITY): Payer: Self-pay | Admitting: Anesthesiology

## 2016-12-22 ENCOUNTER — Ambulatory Visit (HOSPITAL_COMMUNITY)
Admission: RE | Admit: 2016-12-22 | Discharge: 2016-12-22 | Disposition: A | Discharge status: Home / Self Care | Payer: Commercial Managed Care - PPO | Source: Ambulatory Visit | Attending: Surgery | Admitting: Surgery

## 2016-12-22 ENCOUNTER — Encounter (HOSPITAL_COMMUNITY)
Admission: RE | Disposition: A | Discharge status: Home / Self Care | Payer: Self-pay | Source: Ambulatory Visit | Attending: Surgery

## 2016-12-22 DIAGNOSIS — K4020 Bilateral inguinal hernia, without obstruction or gangrene, not specified as recurrent: Secondary | ICD-10-CM | POA: Diagnosis not present

## 2016-12-22 DIAGNOSIS — D176 Benign lipomatous neoplasm of spermatic cord: Secondary | ICD-10-CM | POA: Insufficient documentation

## 2016-12-22 HISTORY — DX: Personal history of other diseases of the digestive system: Z87.19

## 2016-12-22 HISTORY — PX: INGUINAL HERNIA REPAIR: SHX194

## 2016-12-22 SURGERY — REPAIR, HERNIA, INGUINAL, LAPAROSCOPIC
Anesthesia: General | Laterality: Bilateral

## 2016-12-22 MED ORDER — CELECOXIB 200 MG PO CAPS
400.0000 mg | ORAL_CAPSULE | ORAL | Status: AC
  Administered 2016-12-22: 400 mg via ORAL
  Filled 2016-12-22: qty 2

## 2016-12-22 MED ORDER — PROPOFOL 10 MG/ML IV BOLUS
INTRAVENOUS | Status: DC | PRN
  Administered 2016-12-22: 160 mg via INTRAVENOUS

## 2016-12-22 MED ORDER — PROPOFOL 10 MG/ML IV BOLUS
INTRAVENOUS | Status: AC
  Filled 2016-12-22: qty 20

## 2016-12-22 MED ORDER — LIDOCAINE 2% (20 MG/ML) 5 ML SYRINGE
INTRAMUSCULAR | Status: DC | PRN
  Administered 2016-12-22: 100 mg via INTRAVENOUS

## 2016-12-22 MED ORDER — GABAPENTIN 300 MG PO CAPS
300.0000 mg | ORAL_CAPSULE | ORAL | Status: AC
  Administered 2016-12-22: 300 mg via ORAL
  Filled 2016-12-22: qty 1

## 2016-12-22 MED ORDER — SCOPOLAMINE 1 MG/3DAYS TD PT72
1.0000 | MEDICATED_PATCH | TRANSDERMAL | Status: DC
  Administered 2016-12-22: 1.5 mg via TRANSDERMAL
  Filled 2016-12-22: qty 1

## 2016-12-22 MED ORDER — OXYCODONE HCL 5 MG PO TABS
5.0000 mg | ORAL_TABLET | ORAL | Status: DC | PRN
  Administered 2016-12-22: 5 mg via ORAL
  Filled 2016-12-22: qty 1

## 2016-12-22 MED ORDER — ACETAMINOPHEN 500 MG PO TABS
1000.0000 mg | ORAL_TABLET | ORAL | Status: AC
Start: 2016-12-22 — End: 2016-12-22
  Administered 2016-12-22: 1000 mg via ORAL
  Filled 2016-12-22: qty 2

## 2016-12-22 MED ORDER — BUPIVACAINE HCL (PF) 0.25 % IJ SOLN
INTRAMUSCULAR | Status: DC | PRN
  Administered 2016-12-22: 45 mL

## 2016-12-22 MED ORDER — SODIUM CHLORIDE 0.9 % IJ SOLN
INTRAMUSCULAR | Status: AC
  Filled 2016-12-22: qty 50

## 2016-12-22 MED ORDER — CHLORHEXIDINE GLUCONATE CLOTH 2 % EX PADS: 6.0000 | MEDICATED_PAD | Freq: Once | CUTANEOUS | Status: DC

## 2016-12-22 MED ORDER — BUPIVACAINE HCL (PF) 0.25 % IJ SOLN
INTRAMUSCULAR | Status: AC
  Filled 2016-12-22: qty 30

## 2016-12-22 MED ORDER — DEXAMETHASONE SODIUM PHOSPHATE 10 MG/ML IJ SOLN
INTRAMUSCULAR | Status: DC | PRN
  Administered 2016-12-22: 10 mg via INTRAVENOUS

## 2016-12-22 MED ORDER — DEXAMETHASONE SODIUM PHOSPHATE 10 MG/ML IJ SOLN
INTRAMUSCULAR | Status: AC
  Filled 2016-12-22: qty 1

## 2016-12-22 MED ORDER — CEFAZOLIN SODIUM-DEXTROSE 2-4 GM/100ML-% IV SOLN
2.0000 g | INTRAVENOUS | Status: AC
  Administered 2016-12-22: 2 g via INTRAVENOUS

## 2016-12-22 MED ORDER — HYDROMORPHONE HCL 1 MG/ML IJ SOLN
INTRAMUSCULAR | Status: AC
  Filled 2016-12-22: qty 1

## 2016-12-22 MED ORDER — SUGAMMADEX SODIUM 200 MG/2ML IV SOLN
INTRAVENOUS | Status: DC | PRN
  Administered 2016-12-22: 200 mg via INTRAVENOUS

## 2016-12-22 MED ORDER — ROCURONIUM BROMIDE 50 MG/5ML IV SOSY
PREFILLED_SYRINGE | INTRAVENOUS | Status: AC
  Filled 2016-12-22: qty 5

## 2016-12-22 MED ORDER — TRAMADOL HCL 50 MG PO TABS: 50.0000 mg | ORAL_TABLET | Freq: Four times a day (QID) | ORAL | 0 refills | Status: AC | PRN

## 2016-12-22 MED ORDER — ONDANSETRON HCL 4 MG/2ML IJ SOLN
INTRAMUSCULAR | Status: DC | PRN
  Administered 2016-12-22: 4 mg via INTRAVENOUS

## 2016-12-22 MED ORDER — NAPROXEN 500 MG PO TABS: 500.0000 mg | ORAL_TABLET | Freq: Two times a day (BID) | ORAL | 1 refills | Status: AC | PRN

## 2016-12-22 MED ORDER — HYDROMORPHONE HCL 1 MG/ML IJ SOLN
0.2500 mg | INTRAMUSCULAR | Status: DC | PRN
  Administered 2016-12-22 (×2): 0.5 mg via INTRAVENOUS

## 2016-12-22 MED ORDER — MIDAZOLAM HCL 2 MG/2ML IJ SOLN
INTRAMUSCULAR | Status: AC
  Filled 2016-12-22: qty 2

## 2016-12-22 MED ORDER — PROMETHAZINE HCL 25 MG/ML IJ SOLN
6.2500 mg | INTRAMUSCULAR | Status: DC | PRN
  Administered 2016-12-22: 6.25 mg via INTRAVENOUS
  Filled 2016-12-22: qty 1

## 2016-12-22 MED ORDER — ROCURONIUM BROMIDE 10 MG/ML (PF) SYRINGE
PREFILLED_SYRINGE | INTRAVENOUS | Status: DC | PRN
  Administered 2016-12-22: 10 mg via INTRAVENOUS
  Administered 2016-12-22: 40 mg via INTRAVENOUS

## 2016-12-22 MED ORDER — ONDANSETRON HCL 4 MG/2ML IJ SOLN
INTRAMUSCULAR | Status: AC
  Filled 2016-12-22: qty 2

## 2016-12-22 MED ORDER — MIDAZOLAM HCL 5 MG/5ML IJ SOLN
INTRAMUSCULAR | Status: DC | PRN
  Administered 2016-12-22: 2 mg via INTRAVENOUS

## 2016-12-22 MED ORDER — LIDOCAINE 2% (20 MG/ML) 5 ML SYRINGE
INTRAMUSCULAR | Status: AC
  Filled 2016-12-22: qty 5

## 2016-12-22 MED ORDER — FENTANYL CITRATE (PF) 100 MCG/2ML IJ SOLN
INTRAMUSCULAR | Status: DC | PRN
  Administered 2016-12-22 (×5): 50 ug via INTRAVENOUS

## 2016-12-22 MED ORDER — SUGAMMADEX SODIUM 200 MG/2ML IV SOLN
INTRAVENOUS | Status: AC
  Filled 2016-12-22: qty 2

## 2016-12-22 MED ORDER — MEPERIDINE HCL 50 MG/ML IJ SOLN: 6.2500 mg | INTRAMUSCULAR | Status: DC | PRN

## 2016-12-22 MED ORDER — FENTANYL CITRATE (PF) 250 MCG/5ML IJ SOLN
INTRAMUSCULAR | Status: AC
  Filled 2016-12-22: qty 5

## 2016-12-22 MED ORDER — LACTATED RINGERS IV SOLN
INTRAVENOUS | Status: DC | PRN
  Administered 2016-12-22 (×2): via INTRAVENOUS

## 2016-12-22 MED ORDER — CEFAZOLIN SODIUM-DEXTROSE 2-4 GM/100ML-% IV SOLN
INTRAVENOUS | Status: AC
  Filled 2016-12-22: qty 100

## 2016-12-22 MED ORDER — FENTANYL CITRATE (PF) 100 MCG/2ML IJ SOLN
INTRAMUSCULAR | Status: AC
  Filled 2016-12-22: qty 2

## 2016-12-22 MED ORDER — SUCCINYLCHOLINE CHLORIDE 200 MG/10ML IV SOSY
PREFILLED_SYRINGE | INTRAVENOUS | Status: AC
  Filled 2016-12-22: qty 10

## 2016-12-22 MED ORDER — SUCCINYLCHOLINE CHLORIDE 200 MG/10ML IV SOSY
PREFILLED_SYRINGE | INTRAVENOUS | Status: DC | PRN
  Administered 2016-12-22: 120 mg via INTRAVENOUS

## 2016-12-22 SURGICAL SUPPLY — 34 items
CABLE HIGH FREQUENCY MONO STRZ (ELECTRODE) ×3 IMPLANT
CHLORAPREP W/TINT 26ML (MISCELLANEOUS) ×3 IMPLANT
COVER SURGICAL LIGHT HANDLE (MISCELLANEOUS) ×3 IMPLANT
DECANTER SPIKE VIAL GLASS SM (MISCELLANEOUS) ×3 IMPLANT
DEVICE SECURE STRAP 25 ABSORB (INSTRUMENTS) IMPLANT
DRAPE WARM FLUID 44X44 (DRAPE) ×3 IMPLANT
DRSG TEGADERM 2-3/8X2-3/4 SM (GAUZE/BANDAGES/DRESSINGS) ×3 IMPLANT
DRSG TEGADERM 4X4.75 (GAUZE/BANDAGES/DRESSINGS) IMPLANT
ELECT REM PT RETURN 9FT ADLT (ELECTROSURGICAL) ×3
ELECTRODE REM PT RTRN 9FT ADLT (ELECTROSURGICAL) ×1 IMPLANT
GAUZE SPONGE 2X2 8PLY STRL LF (GAUZE/BANDAGES/DRESSINGS) ×1 IMPLANT
GLOVE ECLIPSE 8.0 STRL XLNG CF (GLOVE) ×3 IMPLANT
GLOVE INDICATOR 8.0 STRL GRN (GLOVE) ×3 IMPLANT
GOWN STRL REUS W/TWL XL LVL3 (GOWN DISPOSABLE) ×6 IMPLANT
IRRIG SUCT STRYKERFLOW 2 WTIP (MISCELLANEOUS)
IRRIGATION SUCT STRKRFLW 2 WTP (MISCELLANEOUS) IMPLANT
KIT BASIN OR (CUSTOM PROCEDURE TRAY) ×3 IMPLANT
MARKER SKIN DUAL TIP RULER LAB (MISCELLANEOUS) ×3 IMPLANT
MESH ULTRAPRO 6X6 15CM15CM (Mesh General) ×6 IMPLANT
PAD POSITIONING PINK XL (MISCELLANEOUS) ×3 IMPLANT
SCISSORS LAP 5X35 DISP (ENDOMECHANICALS) ×3 IMPLANT
SLEEVE ADV FIXATION 5X100MM (TROCAR) ×3 IMPLANT
SPONGE GAUZE 2X2 STER 10/PKG (GAUZE/BANDAGES/DRESSINGS) ×2
SUT MNCRL AB 4-0 PS2 18 (SUTURE) ×3 IMPLANT
SUT VIC AB 2-0 SH 27 (SUTURE)
SUT VIC AB 2-0 SH 27X BRD (SUTURE) IMPLANT
SUT VICRYL 0 UR6 27IN ABS (SUTURE) ×3 IMPLANT
TACKER 5MM HERNIA 3.5CML NAB (ENDOMECHANICALS) IMPLANT
TOWEL OR 17X26 10 PK STRL BLUE (TOWEL DISPOSABLE) ×3 IMPLANT
TRAY LAPAROSCOPIC (CUSTOM PROCEDURE TRAY) ×3 IMPLANT
TROCAR ADV FIXATION 5X100MM (TROCAR) ×3 IMPLANT
TROCAR XCEL BLUNT TIP 100MML (ENDOMECHANICALS) ×3 IMPLANT
TUBING INSUF HEATED (TUBING) ×3 IMPLANT
WATER STERILE IRR 1000ML POUR (IV SOLUTION) ×3 IMPLANT

## 2016-12-22 NOTE — H&P (View-Only) (Signed)
Adam Shaw 07/07/2016 2:36 PM Location: [facility] Patient #: [id number] DOB: Apr 23, 1961 Married / Language: English / Race: Black or African American Male   History of Present Illness  The patient is a 56 year old male who presents with an inguinal hernia.   Patient sent for surgical consultation by Dr. Sandi Mariscal, physician Assistant. [facility]. Concern for left inguinal hernia.  Pleasant active male. 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 Noticed a lump about a month ago. More prominent when he is more active. Usually comes back in. He believes his father told him he had pediatric groin hernia repairs when he was very very young. Had cholecystectomy done in 2015 for gallstone pancreatitis. No other abdominal surgeries. Because of concern of the worsening groin swelling, he went saw his primary care physician. Concern for inguinal hernia. Surgical consultation recommended.  Moderately active. Shortness of breath or exertional chest pain. Usually moves his bowels every 1-2 days. Again he does not smoke. No history of skin infection. No problems with urination or defecation. No hesitancy or difficulty starting stream. No nocturia.  No new events.  Rated to consider surgery now.    Allergies Davy Pique Bynum, CMA; 07/07/2016 2:36 PM) No Known Drug Allergies 07/07/2016  Medication History ([name], CMA; 07/07/2016 2:37 PM) Multivitamin Adult (Oral) Active. Medications Reconciled  Vitals ([name] CMA; 07/07/2016 2:36 PM) 07/07/2016 2:36 PM Weight: 132 lb Height: 66in Body Surface Area: 1.68 m Body Mass Index: 21.31 kg/m  Temp.: 64F(Temporal)  Pulse: 62 (Regular)  BP: 124/76 (Sitting, Left Arm, Standard)       Physical Exam [name]; 07/07/2016 3:03 PM) General Mental Status-Alert. General Appearance-Not in acute distress, Not Sickly. Orientation-Oriented X3. Hydration-Well  hydrated. Voice-Normal.  Integumentary Global Assessment Upon inspection and palpation of skin surfaces of the - Axillae: non-tender, no inflammation or ulceration, no drainage. and Distribution of scalp and body hair is normal. General Characteristics Temperature - normal warmth is noted.  Head and Neck Head-normocephalic, atraumatic with no lesions or palpable masses. Face Global Assessment - atraumatic, no absence of expression. Neck Global Assessment - no abnormal movements, no bruit auscultated on the right, no bruit auscultated on the left, no decreased range of motion, non-tender. Trachea-midline. Thyroid Gland Characteristics - non-tender.  Eye Eyeball - Left-Extraocular movements intact, No Nystagmus. Eyeball - Right-Extraocular movements intact, No Nystagmus. Cornea - Left-No Hazy. Cornea - Right-No Hazy. Sclera/Conjunctiva - Left-No scleral icterus, No Discharge. Sclera/Conjunctiva - Right-No scleral icterus, No Discharge. Pupil - Left-Direct reaction to light normal. Pupil - Right-Direct reaction to light normal.  ENMT Ears Pinna - Left - no drainage observed, no generalized tenderness observed. Right - no drainage observed, no generalized tenderness observed. Nose and Sinuses External Inspection of the Nose - no destructive lesion observed. Inspection of the nares - Left - quiet respiration. Right - quiet respiration. Mouth and Throat Lips - Upper Lip - no fissures observed, no pallor noted. Lower Lip - no fissures observed, no pallor noted. Nasopharynx - no discharge present. Oral Cavity/Oropharynx - Tongue - no dryness observed. Oral Mucosa - no cyanosis observed. Hypopharynx - no evidence of airway distress observed.  Chest and Lung Exam Inspection Movements - Normal and Symmetrical. Accessory muscles - No use of accessory muscles in breathing. Palpation Palpation of the chest reveals - Non-tender. Auscultation Breath sounds - Normal  and Clear.  Cardiovascular Auscultation Rhythm - Regular. Murmurs & Other Heart Sounds - Auscultation of the heart reveals - No  Murmurs and No Systolic Clicks.  Abdomen Inspection Inspection of the abdomen reveals - No Visible peristalsis and No Abnormal pulsations. Umbilicus - No Bleeding, No Urine drainage. Palpation/Percussion Palpation and Percussion of the abdomen reveal - Soft, Non Tender, No Rebound tenderness, No Rigidity (guarding) and No Cutaneous hyperesthesia. Note: Abdomen soft. Small laparoscopic upper abdominal incisions. Nontender, nondistended. No guarding. No umbilical hernias   Male Genitourinary Sexual Maturity Tanner 5 - Adult hair pattern and Adult penile size and shape. Note: Left greater than right inguinal hernias on Valsalva. Reducible. Possible left femoral as well. Small groin incisions consistent with probable prior pediatric repair. Normal external genitalia. Epididymi, testes, and spermatic cords normal without any masses.   Peripheral Vascular Upper Extremity Inspection - Left - No Cyanotic nailbeds, Not Ischemic. Right - No Cyanotic nailbeds, Not Ischemic.  Neurologic Neurologic evaluation reveals -normal attention span and ability to concentrate, able to name objects and repeat phrases. Appropriate fund of knowledge , normal sensation and normal coordination. Mental Status Affect - not angry, not paranoid. Cranial Nerves-Normal Bilaterally. Gait-Normal.  Neuropsychiatric Mental status exam performed with findings of-able to articulate well with normal speech/language, rate, volume and coherence, thought content normal with ability to perform basic computations and apply abstract reasoning and no evidence of hallucinations, delusions, obsessions or homicidal/suicidal ideation.  Musculoskeletal Global Assessment Spine, Ribs and Pelvis - no instability, subluxation or laxity. Right Upper Extremity - no instability, subluxation or  laxity.  Lymphatic Head & Neck  General Head & Neck Lymphatics: Bilateral - Description - No Localized lymphadenopathy. Axillary  General Axillary Region: Bilateral - Description - No Localized lymphadenopathy. Femoral & Inguinal  Generalized Femoral & Inguinal Lymphatics: Left - Description - No Localized lymphadenopathy. Right - Description - No Localized lymphadenopathy.    Assessment & Plan  BILATERAL RECURRENT INGUINAL HERNIA WITHOUT OBSTRUCTION OR GANGRENE (K40.21) Impression: Left greater than right recurrent bilateral inguinal hernias in a moderately active male.  I think it would benefit from surgical repair. We do laparoscopic underlay report with mesh. Trying to set this up at a mutually convenient time. He iIs interested in proceeding.  Because he's not having severe debilitating pain, reasonable to go back to work unrestricted for now. Okay to use anti-inflammatories over-the-counter such as ibuprofen/naproxen/acetaminophen. Ice or Heat as needed. We will send a note as well.  He delayed surgery.  Ready to consider surgery now.  Current Plans Pt Education - [facility] Pain Control (Jakyla Reza)  PREOP - ING HERNIA - ENCOUNTER FOR PREOPERATIVE EXAMINATION FOR GENERAL SURGICAL PROCEDURE (Z01.818) Current Plans You are being scheduled for surgery - Our schedulers will call you.  You should hear from our office's scheduling department within 5 working days about the location, date, and time of surgery. We try to make accommodations for patient's preferences in scheduling surgery, but sometimes the OR schedule or the surgeon's schedule prevents [place] from making those accommodations.  If you have not heard from our office [phone]) in 5 working days, call the office and ask for your surgeon's nurse.  If you have other questions about your diagnosis, plan, or surgery, call the office and ask for your surgeon's nurse.  Written instructions provided The anatomy & physiology of the  abdominal wall and pelvic floor was discussed. The pathophysiology of hernias in the inguinal and pelvic region was discussed. Natural history risks such as progressive enlargement, pain, incarceration, and strangulation was discussed. Contributors to complications such as smoking, obesity, diabetes, prior surgery, etc were discussed.  I feel the risks  of no intervention will lead to serious problems that outweigh the operative risks; therefore, I recommended surgery to reduce and repair the hernia. I explained laparoscopic techniques with possible need for an open approach. I noted usual use of mesh to patch and/or buttress hernia repair  Risks such as bleeding, infection, abscess, need for further treatment, heart attack, death, and other risks were discussed. I noted a good likelihood this will help address the problem. Goals of post-operative recovery were discussed as well. Possibility that this will not correct all symptoms was explained. I stressed the importance of low-impact activity, aggressive pain control, avoiding constipation, & not pushing through pain to minimize risk of post-operative chronic pain or injury. Possibility of reherniation was discussed. We will work to minimize complications.  An educational handout further explaining the pathology & treatment options was given as well. Questions were answered. The patient expresses understanding & wishes to proceed with surgery.  Pt Education - Pamphlet Given - Laparoscopic Hernia Repair: discussed with patient and provided information. Pt Education - [facility] Pain Control (Ashleyann Shoun) Pt Education - [facility] Hernia Post-Op HCI (Shanisha Lech): discussed with patient and provided information.

## 2016-12-22 NOTE — Anesthesia Procedure Notes (Signed)
Procedure Name: Intubation Date/Time: 12/22/2016 2:10 PM Performed by: Lind Covert Pre-anesthesia Checklist: Patient identified, Emergency Drugs available, Suction available, Patient being monitored and Timeout performed Patient Re-evaluated:Patient Re-evaluated prior to inductionOxygen Delivery Method: Circle system utilized Preoxygenation: Pre-oxygenation with 100% oxygen Intubation Type: IV induction Laryngoscope Size: Mac and 4 Grade View: Grade I Tube type: Oral Tube size: 7.5 mm Number of attempts: 1 Airway Equipment and Method: Stylet Placement Confirmation: ETT inserted through vocal cords under direct vision,  positive ETCO2 and breath sounds checked- equal and bilateral Secured at: 21 cm Tube secured with: Tape Dental Injury: Teeth and Oropharynx as per pre-operative assessment

## 2016-12-22 NOTE — Op Note (Signed)
12/22/2016  4:00 PM  PATIENT:  Adam Shaw  56 y.o. male  Patient Care Team: Sandi Mariscal, MD as PCP - General (Internal Medicine) London Pepper, MD as Referring Physician (Family Medicine) Michael Boston, MD as Consulting Physician (General Surgery) Autumn Messing III, MD as Consulting Physician (General Surgery)  PRE-OPERATIVE DIAGNOSIS:  RECURRENT BILATERAL INGUINAL HERNIA  POST-OPERATIVE DIAGNOSIS:  RECURRENT BILATERAL INGUINAL HERNIA  PROCEDURE:  Procedure(s): LAPAROSCOPIC BILATERAL INGUINAL HERNIA REPAIR WITH MESH  SURGEON:  Adin Hector, MD  ASSISTANT: None  ANESTHESIA:     Regional ilioinguinal and genitofemoral and spermatic cord nerve blocks  General  EBL:  Total I/O In: -  Out: 50 [Blood:50].  See anesthesia record  Delay start of Pharmacological VTE agent (>24hrs) due to surgical blood loss or risk of bleeding:  no  DRAINS: NONE  SPECIMEN:  NONE  DISPOSITION OF SPECIMEN:  N/A  COUNTS:  YES  PLAN OF CARE: Discharge to home after PACU  PATIENT DISPOSITION:  PACU - hemodynamically stable.  INDICATION: Pleasant active male with obvious left inguinal hernia that bothers him.  I noted a contralateral right anal hernias well.  I recommended laparoscopic exploration repair of hernias found.  The anatomy & physiology of the abdominal wall and pelvic floor was discussed.  The pathophysiology of hernias in the inguinal and pelvic region was discussed.  Natural history risks such as progressive enlargement, pain, incarceration & strangulation was discussed.   Contributors to complications such as smoking, obesity, diabetes, prior surgery, etc were discussed.    I feel the risks of no intervention will lead to serious problems that outweigh the operative risks; therefore, I recommended surgery to reduce and repair the hernia.  I explained laparoscopic techniques with possible need for an open approach.  I noted usual use of mesh to patch and/or buttress hernia  repair  Risks such as bleeding, infection, abscess, need for further treatment, heart attack, death, and other risks were discussed.  I noted a good likelihood this will help address the problem.   Goals of post-operative recovery were discussed as well.  Possibility that this will not correct all symptoms was explained.  I stressed the importance of low-impact activity, aggressive pain control, avoiding constipation, & not pushing through pain to minimize risk of post-operative chronic pain or injury. Possibility of reherniation was discussed.  We will work to minimize complications.     An educational handout further explaining the pathology & treatment options was given as well.  Questions were answered.  The patient expresses understanding & wishes to proceed with surgery.  OR FINDINGS: Bilateral indirect hernias.  Left greater than right.  No direct space hernias.  No femoral obturator hernias.    DESCRIPTION:  The patient was identified & brought into the operating room. The patient was positioned supine with arms tucked. SCDs were active during the entire case. The patient underwent general anesthesia without any difficulty.  The abdomen was prepped and draped in a sterile fashion. The patient's bladder was emptied.  A Surgical Timeout confirmed our plan.  I made a transverse incision through the inferior umbilical fold.  I made a small transverse nick through the anterior rectus fascia contralateral to the inguinal hernia side and placed a 0-vicryl stitch through the fascia.  I placed a Hasson trocar into the preperitoneal plane.  Entry was clean.  We induced carbon dioxide insufflation. Camera inspection revealed no injury.  I used a 37mm angled scope to bluntly free the peritoneum off the infraumbilical  anterior abdominal wall.  I created enough of a preperitoneal pocket to place 24mm ports into the right & left mid-abdomen into this preperitoneal cavity.  I focused attention on the LEFT  pelvis since that was the dominant hernia side.   I used blunt & focused sharp dissection to free the peritoneum off the flank and down to the pubic rim.  I freed the anteriolateral bladder wall off the anteriolateral pelvic wall, sparing midline attachments.   I located a swath of peritoneum going into a hernia fascial defect at the  internal ring consistent with  an indirect inguinal hernia..  I gradually freed the peritoneal hernia sac off safely and reduced it into the preperitoneal space.  I freed the peritoneum off the spermatic vessels & vas deferens.  I freed peritoneum off the retroperitoneum along the psoas muscle.  Spermatic cord lipoma was dissected away & removed.  As anticipated, surgical dissection was challenged by dense adhesions and poor planes resulting in the need for careful repair of the resulting peritoneal defects associated with the chronically incarcerated and inflamed hernia sac.  Repair was done with minimally invasive intracorporeal suturing using absorbable suture.  I checked & assured hemostasis.     I turned attention on the opposite  RIGHT pelvis.  I did dissection in a similar, mirror-image fashion. The patient had an indirect inguinal hernia.Marland Kitchen   Spermatic cord lipoma was dissected away & removed.    I checked & assured hemostasis.     I chose 15x15 cm sheets of ultra-lightweight polypropylene mesh (Ultrapro), one for each side.  I cut a single sigmoid-shaped slit ~6cm from a corner of each mesh.  I placed the meshes into the preperitoneal space & laid them as overlapping diamonds such that at the inferior points, a 6x6 cm corner flap rested in the true anterolateral pelvis, covering the obturator & femoral foramina.   I allowed the bladder to return to the pubis, this helping tuck the corners of the mesh in the anteriolateral pelvis.  The medial corners overlapped each other across midline cephalad to the pubic rim.   This provided >2 inch coverage around the hernias. Because  the defects well covered and not particularly large, I did not place any tacks.   I held the hernia sacs cephalad & evacuated carbon dioxide.  I closed the fascia with absorbable suture.  I closed the skin using 4-0 monocryl stitch.  Sterile dressings were applied.   The patient was extubated & arrived in the PACU in stable condition..  I had discussed postoperative care with the patient in the holding area.  Instructions are written in the chart.  I discussed operative findings, updated the patient's status, discussed probable steps to recovery, and gave postoperative recommendations to the patient's spouse.  Recommendations were made.  Questions were answered.  She expressed understanding & appreciation.   Adin Hector, M.D., F.A.C.S. Gastrointestinal and Minimally Invasive Surgery [facility] [address] [place], [place] [address] [phone] Main / Paging  12/22/2016 4:00 PM

## 2016-12-22 NOTE — Transfer of Care (Signed)
Immediate Anesthesia Transfer of Care Note  Patient: Adam Shaw  Procedure(s) Performed: Procedure(s): LAPAROSCOPIC BILATERAL INGUINAL HERNIA REPAIR WITH MESH (Bilateral)  Patient Location: PACU  Anesthesia Type:General  Level of Consciousness: sedated  Airway & Oxygen Therapy: Patient Spontanous Breathing and Patient connected to face mask oxygen  Post-op Assessment: Report given to RN and Post -op Vital signs reviewed and stable  Post vital signs: Reviewed and stable  Last Vitals:  Vitals:   12/22/16 1307  BP: 118/64  Pulse: 61  Resp: 18  Temp: 36.6 C    Last Pain:  Vitals:   12/22/16 1307  TempSrc: Oral      Patients Stated Pain Goal: 4 ([id number])  Complications: No apparent anesthesia complications

## 2016-12-22 NOTE — Discharge Instructions (Signed)
You have a nausea patch (scopalamine) behind your ear. Remove it about 1:30 PM on 12/23/16. Wear gloves to remove it. Wash the skin with soap and a paper towel. Do not get any ointment on your fingers or in your eyes.     General Anesthesia, Adult, Care After These instructions provide you with information about caring for yourself after your procedure. Your health care provider may also give you more specific instructions. Your treatment has been planned according to current medical practices, but problems sometimes occur. Call your health care provider if you have any problems or questions after your procedure. What can I expect after the procedure? After the procedure, it is common to have:  Vomiting.  A sore throat.  Mental slowness. It is common to feel:  Nauseous.  Cold or shivery.  Sleepy.  Tired.  Sore or achy, even in parts of your body where you did not have surgery. Follow these instructions at home: For at least 24 hours after the procedure:  Do not:  Participate in activities where you could fall or become injured.  Drive.  Use heavy machinery.  Drink alcohol.  Take sleeping pills or medicines that cause drowsiness.  Make important decisions or sign legal documents.  Take care of children on your own.  Rest. Eating and drinking  If you vomit, drink water, juice, or soup when you can drink without vomiting.  Drink enough fluid to keep your urine clear or pale yellow.  Make sure you have little or no nausea before eating solid foods.  Follow the diet recommended by your health care provider. General instructions  Have a responsible adult stay with you until you are awake and alert.  Return to your normal activities as told by your health care provider. Ask your health care provider what activities are safe for you.  Take over-the-counter and prescription medicines only as told by your health care provider.  If you smoke, do not smoke without  supervision.  Keep all follow-up visits as told by your health care provider. This is important. Contact a health care provider if:  You continue to have nausea or vomiting at home, and medicines are not helpful.  You cannot drink fluids or start eating again.  You cannot urinate after 8-12 hours.  You develop a skin rash.  You have fever.  You have increasing redness at the site of your procedure. Get help right away if:  You have difficulty breathing.  You have chest pain.  You have unexpected bleeding.  You feel that you are having a life-threatening or urgent problem. This information is not intended to replace advice given to you by your health care provider. Make sure you discuss any questions you have with your health care provider. Document Released: 02/21/2001 Document Revised: 04/19/2016 Document Reviewed: 10/30/2015 Elsevier Interactive Patient Education  2017 [place]: POST OP INSTRUCTIONS  ######################################################################  EAT Gradually transition to a high fiber diet with a fiber supplement over the next few weeks after discharge.  Start with a pureed / full liquid diet (see below)  WALK Walk an hour a day.  Control your pain to do that.    CONTROL PAIN Control pain so that you can walk, sleep, tolerate sneezing/coughing, go up/down stairs.  HAVE A BOWEL MOVEMENT DAILY Keep your bowels regular to avoid problems.  OK to try a laxative to override constipation.  OK to use an antidairrheal to slow down diarrhea.  Call if not  better after 2 tries  CALL IF YOU HAVE PROBLEMS/CONCERNS Call if you are still struggling despite following these instructions. Call if you have concerns not answered by these instructions  ######################################################################    1. DIET: Follow a light bland diet the first 24 hours after arrival home, such as soup, liquids, crackers,  etc.  Be sure to include lots of fluids daily.  Avoid fast food or heavy meals as your are more likely to get nauseated.  Eat a low fat the next few days after surgery. 2. Take your usually prescribed home medications unless otherwise directed. 3. PAIN CONTROL: a. Pain is best controlled by a usual combination of three different methods TOGETHER: i. Ice/Heat ii. Over the counter pain medication iii. Prescription pain medication b. Most patients will experience some swelling and bruising around the hernia(s) such as the bellybutton, groins, or old incisions.  Ice packs or heating pads (30-60 minutes up to 6 times a day) will help. Use ice for the first few days to help decrease swelling and bruising, then switch to heat to help relax tight/sore spots and speed recovery.  Some people prefer to use ice alone, heat alone, alternating between ice & heat.  Experiment to what works for you.  Swelling and bruising can take several weeks to resolve.   c. It is helpful to take an over-the-counter pain medication regularly for the first few weeks.  Choose one of the following that works best for you: i. Naproxen (Aleve, etc)  Two 220mg  tabs twice a day ii. Ibuprofen (Advil, etc) Three 200mg  tabs four times a day (every meal & bedtime) iii. Acetaminophen (Tylenol, etc) 325-650mg  four times a day (every meal & bedtime) d. A  prescription for pain medication should be given to you upon discharge.  Take your pain medication as prescribed.  i. If you are having problems/concerns with the prescription medicine (does not control pain, nausea, vomiting, rash, itching, etc), please call us [phone] to see if we need to switch you to a different pain medicine that will work better for you and/or control your side effect better. ii. If you need a refill on your pain medication, please contact your pharmacy.  They will contact our office to request authorization. Prescriptions will not be filled after 5 pm or on  week-ends. 4. Avoid getting constipated.  Between the surgery and the pain medications, it is common to experience some constipation.  Increasing fluid intake and taking a fiber supplement (such as Metamucil, Citrucel, FiberCon, MiraLax, etc) 1-2 times a day regularly will usually help prevent this problem from occurring.  A mild laxative (prune juice, Milk of Magnesia, MiraLax, etc) should be taken according to package directions if there are no bowel movements after 48 hours.   5. Wash / shower every day.  You may shower over the dressings as they are waterproof.   6. Remove your waterproof bandages 5 days after surgery.  You may leave the incision open to air.  You may replace a dressing/Band-Aid to cover the incision for comfort if you wish.  Continue to shower over incision(s) after the dressing is off.    7. ACTIVITIES as tolerated:   a. You may resume regular (light) daily activities beginning the next day--such as daily self-care, walking, climbing stairs--gradually increasing activities as tolerated.  If you can walk 30 minutes without difficulty, it is safe to try more intense activity such as jogging, treadmill, bicycling, low-impact aerobics, swimming, etc. b. Save the most intensive and  strenuous activity for last such as sit-ups, heavy lifting, contact sports, etc  Refrain from any heavy lifting or straining until you are off narcotics for pain control.   c. DO NOT PUSH THROUGH PAIN.  Let pain be your guide: If it hurts to do something, don't do it.  Pain is your body warning you to avoid that activity for another week until the pain goes down. d. You may drive when you are no longer taking prescription pain medication, you can comfortably wear a seatbelt, and you can safely maneuver your car and apply brakes. e. Dennis Bast may have sexual intercourse when it is comfortable.  8. FOLLOW UP in our office a. Please call [facility] at [phone] to set up an appointment to see your surgeon in the  office for a follow-up appointment approximately 2-3 weeks after your surgery. b. Make sure that you call for this appointment the day you arrive home to insure a convenient appointment time. 9.  IF YOU HAVE DISABILITY OR FAMILY LEAVE FORMS, BRING THEM TO THE OFFICE FOR PROCESSING.  DO NOT GIVE THEM TO YOUR DOCTOR.  WHEN TO CALL us [phone]: 1. Poor pain control 2. Reactions / problems with new medications (rash/itching, nausea, etc)  3. Fever over 101.5 F (38.5 C) 4. Inability to urinate 5. Nausea and/or vomiting 6. Worsening swelling or bruising 7. Continued bleeding from incision. 8. Increased pain, redness, or drainage from the incision   The clinic staff is available to answer your questions during regular business hours (8:30am-5pm).  Please dont hesitate to call and ask to speak to one of our nurses for clinical concerns.   If you have a medical emergency, go to the nearest emergency room or call 911.  A surgeon from [facility] is always on call at the hospitals in [facility], [place], [place], [place], [place]  [address] ?  [address] [address], [place], [place]   [address] MAIN: [phone] ? TOLL FREE: [phone] ? FAX: [phone] [email or web address]

## 2016-12-22 NOTE — Anesthesia Preprocedure Evaluation (Addendum)
Anesthesia Evaluation  Patient identified by MRN, date of birth, ID band Patient awake    Reviewed: Allergy & Precautions, NPO status , Patient's Chart, lab work & pertinent test results  Airway Mallampati: II  TM Distance: >3 FB Neck ROM: Full    Dental  (+) Poor Dentition, Chipped,    Pulmonary neg pulmonary ROS,    Pulmonary exam normal breath sounds clear to auscultation       Cardiovascular negative cardio ROS Normal cardiovascular exam Rhythm:Regular Rate:Normal     Neuro/Psych negative neurological ROS  negative psych ROS   GI/Hepatic Neg liver ROS, Hx/o Gallstone Pancreatitis   Endo/Other  negative endocrine ROS  Renal/GU negative Renal ROS  negative genitourinary   Musculoskeletal Recurrent Bilateral inguinal hernias   Abdominal (+) - obese,   Peds  Hematology negative hematology ROS (+)   Anesthesia Other Findings   Reproductive/Obstetrics                            Lab Results  Component Value Date   WBC 6.5 12/20/2016   HGB 15.1 12/20/2016   HCT 45.0 12/20/2016   MCV 81.7 12/20/2016   PLT 165 12/20/2016    Anesthesia Physical Anesthesia Plan  ASA: I  Anesthesia Plan: General   Post-op Pain Management:    Induction: Intravenous  Airway Management Planned: Oral ETT  Additional Equipment:   Intra-op Plan:   Post-operative Plan: Extubation in OR  Informed Consent: I have reviewed the patients History and Physical, chart, labs and discussed the procedure including the risks, benefits and alternatives for the proposed anesthesia with the patient or authorized representative who has indicated his/her understanding and acceptance.     Plan Discussed with: CRNA, Anesthesiologist and Surgeon  Anesthesia Plan Comments:         Anesthesia Quick Evaluation

## 2016-12-22 NOTE — Anesthesia Postprocedure Evaluation (Signed)
Anesthesia Post Note  Patient: Adam Shaw  Procedure(s) Performed: Procedure(s) (LRB): LAPAROSCOPIC BILATERAL INGUINAL HERNIA REPAIR WITH MESH (Bilateral)  Patient location during evaluation: PACU Anesthesia Type: General Level of consciousness: awake and alert and oriented Pain management: pain level controlled Vital Signs Assessment: post-procedure vital signs reviewed and stable Respiratory status: spontaneous breathing, nonlabored ventilation and respiratory function stable Cardiovascular status: blood pressure returned to baseline and stable Postop Assessment: no signs of nausea or vomiting Anesthetic complications: no       Last Vitals:  Vitals:   12/22/16 1645 12/22/16 1650  BP: (!) 120/96   Pulse: 75 64  Resp: 20 20  Temp:      Last Pain:  Vitals:   12/22/16 1650  TempSrc:   PainSc: 5                  Kristion Holifield A.

## 2016-12-22 NOTE — Interval H&P Note (Signed)
History and Physical Interval Note:  12/22/2016 1:44 PM  Adam Shaw  has presented today for surgery, with the diagnosis of RECURRENT BILATERAL INGUINAL HERNIA  The various methods of treatment have been discussed with the patient and family. After consideration of risks, benefits and other options for treatment, the patient has consented to  Procedure(s): LAPAROSCOPIC BILATERAL INGUINAL HERNIA, POSSIBLE OPEN APPROACH (Bilateral) as a surgical intervention .  The patient's history has been reviewed, patient examined, no change in status, stable for surgery.  I have reviewed the patient's chart and labs.  Questions were answered to the patient's satisfaction.     Sulamita Lafountain C.

## 2016-12-22 NOTE — Progress Notes (Addendum)
Patient is very sleepy. Unable to keep eyes open. Up to recliner chair. Pain is a four but has trouble keeping eyes open. Nausea is improved.  1945  Patient is able to ambulate to bathroom to void. Ambulated from 1301 to 1320 and back. Tolerated well.  Family friend is coming to pick up patient and wife as wife does not drive.

## 2016-12-23 ENCOUNTER — Encounter (HOSPITAL_COMMUNITY): Payer: Self-pay | Admitting: Surgery
